# Patient Record
Sex: Female | Born: 1935 | Race: Black or African American | Hispanic: No | Smoking: Never smoker
Health system: Southern US, Community
[De-identification: ages and names within clinical notes are randomized; demographics above are authoritative.]

## PROBLEM LIST (undated history)

## (undated) DIAGNOSIS — F0390 Unspecified dementia without behavioral disturbance: Secondary | ICD-10-CM

## (undated) DIAGNOSIS — I10 Essential (primary) hypertension: Secondary | ICD-10-CM

## (undated) DIAGNOSIS — F0280 Dementia in other diseases classified elsewhere without behavioral disturbance: Secondary | ICD-10-CM

## (undated) DIAGNOSIS — K5900 Constipation, unspecified: Secondary | ICD-10-CM

## (undated) DIAGNOSIS — N390 Urinary tract infection, site not specified: Secondary | ICD-10-CM

## (undated) DIAGNOSIS — I739 Peripheral vascular disease, unspecified: Secondary | ICD-10-CM

## (undated) DIAGNOSIS — G309 Alzheimer's disease, unspecified: Secondary | ICD-10-CM

## (undated) DIAGNOSIS — C189 Malignant neoplasm of colon, unspecified: Secondary | ICD-10-CM

## (undated) DIAGNOSIS — I639 Cerebral infarction, unspecified: Secondary | ICD-10-CM

## (undated) DIAGNOSIS — D649 Anemia, unspecified: Secondary | ICD-10-CM

## (undated) DIAGNOSIS — N189 Chronic kidney disease, unspecified: Secondary | ICD-10-CM

## (undated) HISTORY — PX: NO PAST SURGERIES: SHX2092

---

## 2004-06-06 ENCOUNTER — Ambulatory Visit: Payer: Self-pay | Admitting: Family Medicine

## 2004-10-04 ENCOUNTER — Ambulatory Visit: Payer: Self-pay | Admitting: Family Medicine

## 2005-06-20 ENCOUNTER — Ambulatory Visit: Payer: Self-pay | Admitting: Gastroenterology

## 2005-06-27 ENCOUNTER — Ambulatory Visit: Payer: Self-pay | Admitting: General Surgery

## 2005-06-27 ENCOUNTER — Other Ambulatory Visit: Payer: Self-pay

## 2005-07-04 ENCOUNTER — Inpatient Hospital Stay: Payer: Self-pay | Admitting: General Surgery

## 2005-07-09 ENCOUNTER — Inpatient Hospital Stay: Payer: Self-pay | Admitting: General Surgery

## 2005-07-09 ENCOUNTER — Ambulatory Visit: Payer: Self-pay | Admitting: General Surgery

## 2006-08-24 ENCOUNTER — Emergency Department: Payer: Self-pay | Admitting: Internal Medicine

## 2006-08-28 ENCOUNTER — Ambulatory Visit: Payer: Self-pay | Admitting: Family Medicine

## 2007-02-22 ENCOUNTER — Emergency Department: Payer: Self-pay | Admitting: Emergency Medicine

## 2007-02-23 ENCOUNTER — Other Ambulatory Visit: Payer: Self-pay

## 2007-09-18 ENCOUNTER — Ambulatory Visit: Payer: Self-pay | Admitting: Family Medicine

## 2008-09-22 ENCOUNTER — Ambulatory Visit: Payer: Self-pay | Admitting: Family Medicine

## 2009-07-17 ENCOUNTER — Emergency Department: Payer: Self-pay | Admitting: Internal Medicine

## 2009-07-28 ENCOUNTER — Emergency Department: Payer: Self-pay | Admitting: Emergency Medicine

## 2010-05-26 IMAGING — CR DG CHEST 2V
1 series · 2 of 2 positions shown · non-contrast
Comparison: none

REASON FOR EXAM: SOB
COMMENTS:

[Series 1: view not recorded · 0.17mm/px · 2 of 2 slices shown]
[im 1/2]
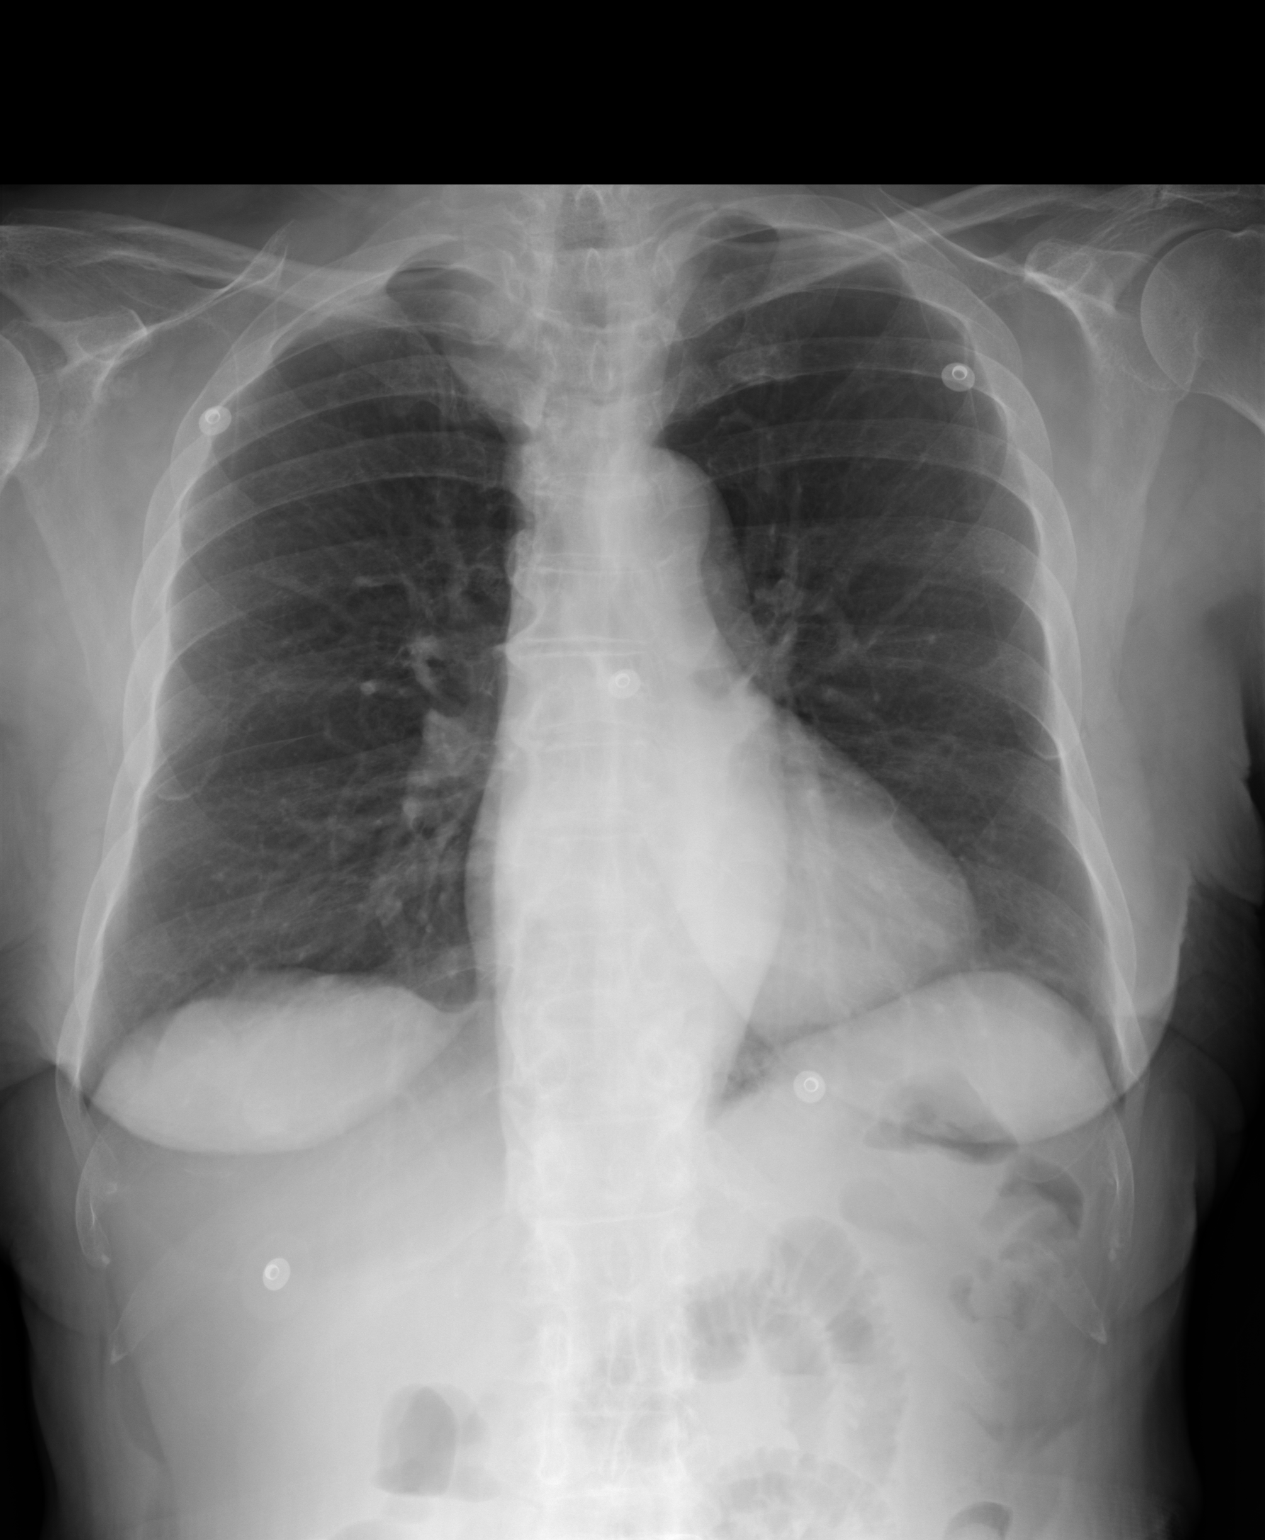
[im 2/2]
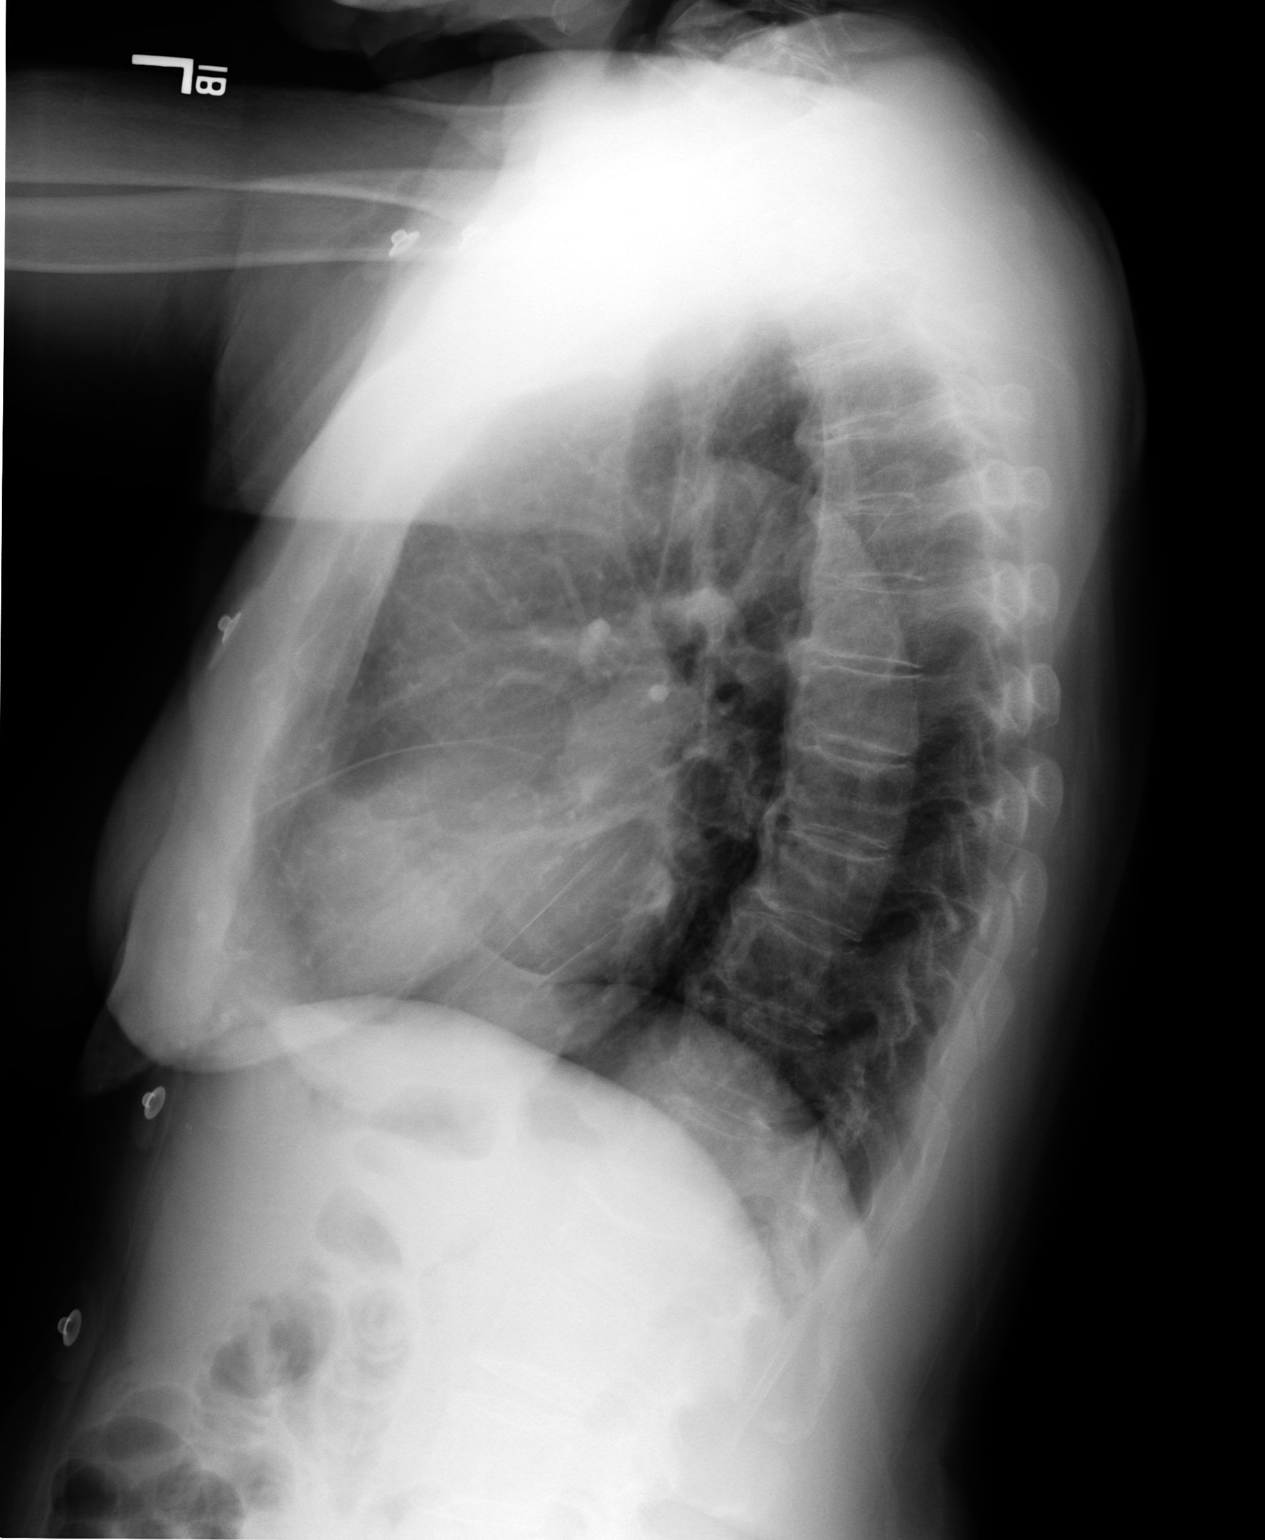

[2 of 2 positions shown; findings below may reference images not displayed]

PROCEDURE:     DXR - DXR CHEST PA (OR AP) AND LATERAL  - July 28, 2009  [DATE]

RESULT:     Comparison is made to the exam of 07/17/2009.

Degenerative changes are seen in the spine. The cardiac silhouette is
normal. There is no edema, infiltrate, effusion or pneumothorax. The bony
and mediastinal structures are unremarkable.
IMPRESSION: No acute cardiopulmonary disease.

## 2010-10-30 ENCOUNTER — Emergency Department: Payer: Self-pay | Admitting: Emergency Medicine

## 2011-03-05 ENCOUNTER — Ambulatory Visit: Payer: Self-pay | Admitting: Family Medicine

## 2012-10-27 ENCOUNTER — Emergency Department: Payer: Self-pay | Admitting: Unknown Physician Specialty

## 2012-10-27 LAB — CBC
HCT: 40.6 % (ref 35.0–47.0)
HGB: 13.5 g/dL (ref 12.0–16.0)
MCV: 88 fL (ref 80–100)
Platelet: 194 10*3/uL (ref 150–440)
RBC: 4.61 10*6/uL (ref 3.80–5.20)
WBC: 8.6 10*3/uL (ref 3.6–11.0)

## 2012-10-27 LAB — COMPREHENSIVE METABOLIC PANEL
Albumin: 3.2 g/dL — ABNORMAL LOW (ref 3.4–5.0)
Alkaline Phosphatase: 94 U/L (ref 50–136)
Anion Gap: 5 — ABNORMAL LOW (ref 7–16)
BUN: 22 mg/dL — ABNORMAL HIGH (ref 7–18)
Chloride: 103 mmol/L (ref 98–107)
Co2: 31 mmol/L (ref 21–32)
Creatinine: 0.93 mg/dL (ref 0.60–1.30)
EGFR (African American): >60
Osmolality: 284 (ref 275–301)
SGOT(AST): 23 U/L (ref 15–37)
Sodium: 139 mmol/L (ref 136–145)
Total Protein: 6.9 g/dL (ref 6.4–8.2)

## 2012-10-27 LAB — TROPONIN I: Troponin-I: <0.02 ng/mL

## 2012-10-28 LAB — TSH: Thyroid Stimulating Horm: 1.98 u[IU]/mL

## 2012-10-28 LAB — URINALYSIS, COMPLETE
Bilirubin,UR: NEGATIVE
Blood: NEGATIVE
Glucose,UR: >=500 mg/dL (ref 0–75)
Ketone: NEGATIVE
Leukocyte Esterase: NEGATIVE
Nitrite: NEGATIVE
Ph: 5 (ref 4.5–8.0)
Protein: NEGATIVE
Specific Gravity: 1.027 (ref 1.003–1.030)
Squamous Epithelial: 1

## 2013-09-29 ENCOUNTER — Emergency Department: Payer: Self-pay | Admitting: Emergency Medicine

## 2013-10-01 ENCOUNTER — Emergency Department: Payer: Self-pay | Admitting: Emergency Medicine

## 2013-10-04 ENCOUNTER — Emergency Department: Payer: Self-pay | Admitting: Emergency Medicine

## 2013-10-04 LAB — WOUND CULTURE

## 2013-12-12 ENCOUNTER — Emergency Department: Payer: Self-pay | Admitting: Emergency Medicine

## 2013-12-12 LAB — CBC
HCT: 34 % — ABNORMAL LOW (ref 35.0–47.0)
HGB: 10.9 g/dL — ABNORMAL LOW (ref 12.0–16.0)
MCH: 29.1 pg (ref 26.0–34.0)
MCHC: 32.1 g/dL (ref 32.0–36.0)
MCV: 91 fL (ref 80–100)
Platelet: 193 10*3/uL (ref 150–440)
RBC: 3.75 10*6/uL — AB (ref 3.80–5.20)
RDW: 13.2 % (ref 11.5–14.5)
WBC: 6.7 10*3/uL (ref 3.6–11.0)

## 2013-12-12 LAB — COMPREHENSIVE METABOLIC PANEL
ALK PHOS: 60 U/L
ANION GAP: 7 (ref 7–16)
AST: 18 U/L (ref 15–37)
Albumin: 2.7 g/dL — ABNORMAL LOW (ref 3.4–5.0)
BILIRUBIN TOTAL: 0.3 mg/dL (ref 0.2–1.0)
BUN: 25 mg/dL — AB (ref 7–18)
CO2: 32 mmol/L (ref 21–32)
CREATININE: 1.31 mg/dL — AB (ref 0.60–1.30)
Calcium, Total: 8.2 mg/dL — ABNORMAL LOW (ref 8.5–10.1)
Chloride: 108 mmol/L — ABNORMAL HIGH (ref 98–107)
GFR CALC AF AMER: 44 — AB
GFR CALC NON AF AMER: 38 — AB
Glucose: 144 mg/dL — ABNORMAL HIGH (ref 65–99)
OSMOLALITY: 299 (ref 275–301)
Potassium: 3.8 mmol/L (ref 3.5–5.1)
SGPT (ALT): 17 U/L
Sodium: 147 mmol/L — ABNORMAL HIGH (ref 136–145)
TOTAL PROTEIN: 5.6 g/dL — AB (ref 6.4–8.2)

## 2013-12-12 LAB — URINALYSIS, COMPLETE
BACTERIA: NONE SEEN
BLOOD: NEGATIVE
Bilirubin,UR: NEGATIVE
GLUCOSE, UR: NEGATIVE mg/dL (ref 0–75)
Hyaline Cast: 6
Ketone: NEGATIVE
LEUKOCYTE ESTERASE: NEGATIVE
Nitrite: NEGATIVE
Ph: 5 (ref 4.5–8.0)
Protein: NEGATIVE
SPECIFIC GRAVITY: 1.024 (ref 1.003–1.030)
Squamous Epithelial: 17

## 2015-02-17 ENCOUNTER — Emergency Department: Payer: Medicare Other

## 2015-02-17 ENCOUNTER — Emergency Department
Admission: EM | Admit: 2015-02-17 | Discharge: 2015-02-18 | Disposition: A | Discharge status: Home / Self Care | Payer: Medicare Other | Attending: Emergency Medicine | Admitting: Emergency Medicine

## 2015-02-17 DIAGNOSIS — R4182 Altered mental status, unspecified: Secondary | ICD-10-CM | POA: Diagnosis not present

## 2015-02-17 DIAGNOSIS — I10 Essential (primary) hypertension: Secondary | ICD-10-CM | POA: Insufficient documentation

## 2015-02-17 DIAGNOSIS — Z7982 Long term (current) use of aspirin: Secondary | ICD-10-CM | POA: Insufficient documentation

## 2015-02-17 DIAGNOSIS — R0989 Other specified symptoms and signs involving the circulatory and respiratory systems: Secondary | ICD-10-CM | POA: Diagnosis present

## 2015-02-17 DIAGNOSIS — Z79899 Other long term (current) drug therapy: Secondary | ICD-10-CM | POA: Insufficient documentation

## 2015-02-17 DIAGNOSIS — Z7902 Long term (current) use of antithrombotics/antiplatelets: Secondary | ICD-10-CM | POA: Diagnosis not present

## 2015-02-17 DIAGNOSIS — R531 Weakness: Secondary | ICD-10-CM | POA: Diagnosis not present

## 2015-02-17 NOTE — ED Notes (Signed)
Pt washed and cleaned of stool by Lynelle Smoke, EDT and Nellie, RN. Pericare performed.

## 2015-02-17 NOTE — ED Provider Notes (Addendum)
[facility] Emergency Department Provider Note  Time seen: 11:45 PM  I have reviewed the triage vital signs and the nursing notes.   HISTORY  Chief Complaint Choking    HPI [name] is a 81 y.o. female with a past medical history of dementia, hypertension who presents to the emergency department after choking. According to report family state the patient was eating a hot dog when she began choking, and became nearly unresponsive. They called EMS. EMS states upon arrival the patient is awake, and talking to them, no distress noted. Patient remains well upon arrival to the emergency department. Patient has dementia, but denies any complaints. Denies any chest pain or trouble breathing. Currently awaiting family arrival.   No past medical history on file.  There are no active problems to display for this patient.   No past surgical history on file.  Current Outpatient Rx  Name  Route  Sig  Dispense  Refill  . aspirin EC 81 MG tablet   Oral   Take 81 mg by mouth daily.         Marland Kitchen atenolol (TENORMIN) 50 MG tablet   Oral   Take 50 mg by mouth daily.         Marland Kitchen CALCIUM-VITAMIN D PO   Oral   Take 1 tablet by mouth 2 (two) times daily.         . clopidogrel (PLAVIX) 75 MG tablet   Oral   Take 75 mg by mouth daily.         Marland Kitchen donepezil (ARICEPT) 10 MG tablet   Oral   Take 10 mg by mouth daily.         . felodipine (PLENDIL) 10 MG 24 hr tablet   Oral   Take 10 mg by mouth daily.         . hydrochlorothiazide (MICROZIDE) 12.5 MG capsule   Oral   Take 12.5 mg by mouth daily.         . memantine (NAMENDA) 5 MG tablet   Oral   Take 5 mg by mouth daily.         . potassium chloride SA (K-DUR,KLOR-CON) 20 MEQ tablet   Oral   Take 20 mEq by mouth daily.         . ranitidine (ZANTAC) 150 MG tablet   Oral   Take 150 mg by mouth daily.         . sertraline (ZOLOFT) 25 MG tablet   Oral   Take 50 mg by mouth daily.          . traZODone (DESYREL) 100 MG tablet   Oral   Take 100 mg by mouth at bedtime. Take 2 tablets PO at bedtime.           Allergies Review of patient's allergies indicates not on file.  No family history on file.  Social History Social History  Substance Use Topics  . Smoking status: Not on file  . Smokeless tobacco: Not on file  . Alcohol Use: Not on file    Review of Systems Constitutional: Negative for fever. Cardiovascular: Negative for chest pain. Respiratory: Negative for shortness of breath. Gastrointestinal: Negative for abdominal pain 10-point ROS otherwise negative, although likely limited by patient's dementia.  ____________________________________________   PHYSICAL EXAM:  VITAL SIGNS: ED Triage Vitals  Enc Vitals Group     BP 02/17/15 2254 144/71 mmHg     Pulse Rate 02/17/15 2254 52  Resp 02/17/15 2254 14     Temp 02/17/15 2254 97.4 F (36.3 C)     Temp Source 02/17/15 2254 Oral     SpO2 02/17/15 2248 98 %     Weight 02/17/15 2254 150 lb (68.04 kg)     Height 02/17/15 2254 5\' 3"  (1.6 m)     Head Cir --      Peak Flow --      Pain Score --      Pain Loc --      Pain Edu? --      Excl. in [place]? --    Constitutional: Alert. Well appearing and in no distress. Eyes: Normal exam ENT   Head: Normocephalic and atraumatic.   Mouth/Throat: Mucous membranes are moist. Cardiovascular: Normal rate, regular rhythm. Respiratory: Normal respiratory effort without tachypnea nor retractions. Breath sounds are clear and equal bilaterally. No wheezes/rales/rhonchi. Gastrointestinal: Soft and nontender. No distention.   Neurologic:  Answers simple questions, follows commands. Skin:  Skin is warm, dry and intact.  Psychiatric: Mood and affect are normal.  ____________________________________________   RADIOLOGY  Chest x-ray shows no acute abnormality  EKG reviewed and interpreted by myself shows sinus rhythm at 53 bpm, widened QRS, left axis  deviation, nonspecific ST changes.  ____________________________________________   INITIAL IMPRESSION / ASSESSMENT AND PLAN / ED COURSE  Pertinent labs & imaging results that were available during my care of the patient were reviewed by me and considered in my medical decision making (see chart for details).  Patient appears very well and the emergency department. Clear breath sounds, no distress. Answers questions, follows commands. I'll call the patient's family, they are on their way to the emergency department. Once the family arrives we will obtain further details and further history. We'll obtain a chest x-ray to help rule out aspiration.  Patient's daughter is now here, gives a slightly different story. States the patient was eating dinner, and the daughter left the room and when the daughter came back the patient was slumped over at the table. States she got the patient on the ground and the patient started to come back around, they called EMS and brought the patient to the emergency department. Daughter states the patient is now at her baseline. But states she has never done this before. Given this new information we'll proceed with a CT head, blood work and urinalysis to help further evaluate.  Labs are largely within normal limits. We were unable to obtain an in and out urinalysis after multiple times. I discussed this with the daughter, she states with everything else looking normal she is comfortable going home and following up with a primary care doctor if needed. I discussed very strict return precautions for any further concerning symptoms. Daughter is agreeable to plan. ____________________________________________   FINAL CLINICAL IMPRESSION(S) / ED DIAGNOSES  Weakness Altered mental status, resolved   Harvest Dark, MD 02/18/15 8250  Harvest Dark, MD 02/18/15 [phone]

## 2015-02-17 NOTE — ED Notes (Signed)
Pt arrives to ED via [facility] for c/o choking leading to unresponsiveness. Per EMS, pt was eating a hotdog with the family around 10pm when she began to choke and became unresponsive. EMS reports the pt was alert upon their arrival. EMS reports pt has h/x of dementia and incontinence; pt noted to have a Depends undergarment full of stool upon arrival. Pt is in NAD, respirations even, regular and unlabored.

## 2015-02-18 ENCOUNTER — Emergency Department: Payer: Medicare Other

## 2015-02-18 DIAGNOSIS — R4182 Altered mental status, unspecified: Secondary | ICD-10-CM | POA: Diagnosis not present

## 2015-02-18 LAB — CBC WITH DIFFERENTIAL/PLATELET
BASOS ABS: 0.1 10*3/uL (ref 0–0.1)
BASOS PCT: 2 %
EOS PCT: 3 %
Eosinophils Absolute: 0.2 10*3/uL (ref 0–0.7)
HCT: 37.7 % (ref 35.0–47.0)
Hemoglobin: 12.2 g/dL (ref 12.0–16.0)
Lymphocytes Relative: 21 %
Lymphs Abs: 1 10*3/uL (ref 1.0–3.6)
MCH: 28.3 pg (ref 26.0–34.0)
MCHC: 32.3 g/dL (ref 32.0–36.0)
MCV: 87.5 fL (ref 80.0–100.0)
MONO ABS: 0.5 10*3/uL (ref 0.2–0.9)
Monocytes Relative: 10 %
Neutro Abs: 3.3 10*3/uL (ref 1.4–6.5)
Neutrophils Relative %: 64 %
PLATELETS: 156 10*3/uL (ref 150–440)
RBC: 4.31 MIL/uL (ref 3.80–5.20)
RDW: 13.5 % (ref 11.5–14.5)
WBC: 5.1 10*3/uL (ref 3.6–11.0)

## 2015-02-18 LAB — COMPREHENSIVE METABOLIC PANEL
ALT: 13 U/L — AB (ref 14–54)
AST: 21 U/L (ref 15–41)
Albumin: 3.4 g/dL — ABNORMAL LOW (ref 3.5–5.0)
Alkaline Phosphatase: 60 U/L (ref 38–126)
Anion gap: 3 — ABNORMAL LOW (ref 5–15)
BILIRUBIN TOTAL: 0.4 mg/dL (ref 0.3–1.2)
BUN: 33 mg/dL — AB (ref 6–20)
CO2: 31 mmol/L (ref 22–32)
CREATININE: 1.2 mg/dL — AB (ref 0.44–1.00)
Calcium: 8.9 mg/dL (ref 8.9–10.3)
Chloride: 109 mmol/L (ref 101–111)
GFR calc Af Amer: 46 mL/min — ABNORMAL LOW (ref >60)
GFR, EST NON AFRICAN AMERICAN: 40 mL/min — AB (ref >60)
Glucose, Bld: 134 mg/dL — ABNORMAL HIGH (ref 65–99)
Potassium: 3.4 mmol/L — ABNORMAL LOW (ref 3.5–5.1)
Sodium: 143 mmol/L (ref 135–145)
TOTAL PROTEIN: 6.3 g/dL — AB (ref 6.5–8.1)

## 2015-02-18 LAB — TROPONIN I

## 2015-02-18 LAB — C DIFFICILE QUICK SCREEN W PCR REFLEX
C DIFFICILE (CDIFF) TOXIN: NEGATIVE
C DIFFICLE (CDIFF) ANTIGEN: NEGATIVE
C Diff interpretation: NEGATIVE

## 2015-02-18 NOTE — Discharge Instructions (Signed)
As we have discussed your mother's workup today in the emergency department as shown largely normal results. Please follow-up with her primary care physician in the next 1-2 days for recheck/reevaluation. Please return to the emergency department for any concerning symptoms, or for any further episodes of weakness.

## 2015-02-18 NOTE — ED Notes (Signed)
Patient and daughter with no complaints at this time. Respirations even and unlabored. Skin warm/dry. Discharge instructions reviewed with patient and daughter at this time. Patient and daughter given opportunity to voice concerns/ask questions. IV removed per policy and band-aid applied to site. Patient discharged at this time and left Emergency Department, via wheelchair.   

## 2016-04-27 ENCOUNTER — Inpatient Hospital Stay
Admission: EM | Admit: 2016-04-27 | Discharge: 2016-04-28 | DRG: 064 | Payer: Medicare (Managed Care) | Attending: Internal Medicine | Admitting: Internal Medicine

## 2016-04-27 ENCOUNTER — Encounter: Payer: Self-pay | Admitting: Emergency Medicine

## 2016-04-27 ENCOUNTER — Emergency Department: Payer: Medicare (Managed Care)

## 2016-04-27 DIAGNOSIS — N390 Urinary tract infection, site not specified: Secondary | ICD-10-CM | POA: Diagnosis present

## 2016-04-27 DIAGNOSIS — Z85038 Personal history of other malignant neoplasm of large intestine: Secondary | ICD-10-CM

## 2016-04-27 DIAGNOSIS — G309 Alzheimer's disease, unspecified: Secondary | ICD-10-CM | POA: Diagnosis present

## 2016-04-27 DIAGNOSIS — Z7982 Long term (current) use of aspirin: Secondary | ICD-10-CM

## 2016-04-27 DIAGNOSIS — I739 Peripheral vascular disease, unspecified: Secondary | ICD-10-CM | POA: Diagnosis present

## 2016-04-27 DIAGNOSIS — G9340 Encephalopathy, unspecified: Secondary | ICD-10-CM | POA: Diagnosis present

## 2016-04-27 DIAGNOSIS — Z8673 Personal history of transient ischemic attack (TIA), and cerebral infarction without residual deficits: Secondary | ICD-10-CM

## 2016-04-27 DIAGNOSIS — K5900 Constipation, unspecified: Secondary | ICD-10-CM

## 2016-04-27 DIAGNOSIS — N183 Chronic kidney disease, stage 3 (moderate): Secondary | ICD-10-CM | POA: Diagnosis present

## 2016-04-27 DIAGNOSIS — Z79899 Other long term (current) drug therapy: Secondary | ICD-10-CM

## 2016-04-27 DIAGNOSIS — F0280 Dementia in other diseases classified elsewhere without behavioral disturbance: Secondary | ICD-10-CM | POA: Diagnosis present

## 2016-04-27 DIAGNOSIS — R7989 Other specified abnormal findings of blood chemistry: Secondary | ICD-10-CM

## 2016-04-27 DIAGNOSIS — N189 Chronic kidney disease, unspecified: Secondary | ICD-10-CM | POA: Diagnosis present

## 2016-04-27 DIAGNOSIS — I129 Hypertensive chronic kidney disease with stage 1 through stage 4 chronic kidney disease, or unspecified chronic kidney disease: Secondary | ICD-10-CM | POA: Diagnosis present

## 2016-04-27 DIAGNOSIS — Z7902 Long term (current) use of antithrombotics/antiplatelets: Secondary | ICD-10-CM

## 2016-04-27 DIAGNOSIS — I611 Nontraumatic intracerebral hemorrhage in hemisphere, cortical: Principal | ICD-10-CM | POA: Diagnosis present

## 2016-04-27 DIAGNOSIS — I10 Essential (primary) hypertension: Secondary | ICD-10-CM | POA: Diagnosis present

## 2016-04-27 DIAGNOSIS — R4189 Other symptoms and signs involving cognitive functions and awareness: Secondary | ICD-10-CM

## 2016-04-27 HISTORY — DX: Dementia in other diseases classified elsewhere, unspecified severity, without behavioral disturbance, psychotic disturbance, mood disturbance, and anxiety: F02.80

## 2016-04-27 HISTORY — DX: Cerebral infarction, unspecified: I63.9

## 2016-04-27 HISTORY — DX: Essential (primary) hypertension: I10

## 2016-04-27 HISTORY — DX: Unspecified dementia, unspecified severity, without behavioral disturbance, psychotic disturbance, mood disturbance, and anxiety: F03.90

## 2016-04-27 HISTORY — DX: Alzheimer's disease, unspecified: G30.9

## 2016-04-27 HISTORY — DX: Malignant neoplasm of colon, unspecified: C18.9

## 2016-04-27 HISTORY — DX: Peripheral vascular disease, unspecified: I73.9

## 2016-04-27 HISTORY — DX: Urinary tract infection, site not specified: N39.0

## 2016-04-27 HISTORY — DX: Chronic kidney disease, unspecified: N18.9

## 2016-04-27 HISTORY — DX: Constipation, unspecified: K59.00

## 2016-04-27 HISTORY — DX: Anemia, unspecified: D64.9

## 2016-04-27 LAB — COMPREHENSIVE METABOLIC PANEL
ALBUMIN: 3.8 g/dL (ref 3.5–5.0)
ALK PHOS: 74 U/L (ref 38–126)
ALT: 16 U/L (ref 14–54)
ANION GAP: 12 (ref 5–15)
AST: 38 U/L (ref 15–41)
BUN: 19 mg/dL (ref 6–20)
CALCIUM: 9.3 mg/dL (ref 8.9–10.3)
CHLORIDE: 100 mmol/L — AB (ref 101–111)
CO2: 26 mmol/L (ref 22–32)
Creatinine, Ser: 0.77 mg/dL (ref 0.44–1.00)
GFR calc Af Amer: >60 mL/min (ref >60)
GFR calc non Af Amer: >60 mL/min (ref >60)
GLUCOSE: 143 mg/dL — AB (ref 65–99)
Potassium: 3 mmol/L — ABNORMAL LOW (ref 3.5–5.1)
SODIUM: 138 mmol/L (ref 135–145)
Total Bilirubin: 0.5 mg/dL (ref 0.3–1.2)
Total Protein: 7.3 g/dL (ref 6.5–8.1)

## 2016-04-27 LAB — URINALYSIS, COMPLETE (UACMP) WITH MICROSCOPIC
Bilirubin Urine: NEGATIVE
Glucose, UA: 150 mg/dL — AB
Hgb urine dipstick: NEGATIVE
KETONES UR: 20 mg/dL — AB
Nitrite: NEGATIVE
PROTEIN: 30 mg/dL — AB
SQUAMOUS EPITHELIAL / LPF: NONE SEEN
Specific Gravity, Urine: 1.017 (ref 1.005–1.030)
pH: 6 (ref 5.0–8.0)

## 2016-04-27 LAB — CBC
HEMATOCRIT: 42.1 % (ref 35.0–47.0)
HEMOGLOBIN: 14.4 g/dL (ref 12.0–16.0)
MCH: 29.1 pg (ref 26.0–34.0)
MCHC: 34.2 g/dL (ref 32.0–36.0)
MCV: 85.1 fL (ref 80.0–100.0)
Platelets: 216 10*3/uL (ref 150–440)
RBC: 4.95 MIL/uL (ref 3.80–5.20)
RDW: 13.4 % (ref 11.5–14.5)
WBC: 10.6 10*3/uL (ref 3.6–11.0)

## 2016-04-27 LAB — LACTIC ACID, PLASMA
LACTIC ACID, VENOUS: 2.3 mmol/L — AB (ref 0.5–1.9)
Lactic Acid, Venous: 1.4 mmol/L (ref 0.5–1.9)

## 2016-04-27 LAB — LIPASE, BLOOD: LIPASE: 19 U/L (ref 11–51)

## 2016-04-27 MED ORDER — IOPAMIDOL (ISOVUE-300) INJECTION 61%
100.0000 mL | Freq: Once | INTRAVENOUS | Status: AC | PRN
  Administered 2016-04-27: 100 mL via INTRAVENOUS

## 2016-04-27 MED ORDER — MORPHINE SULFATE (PF) 2 MG/ML IV SOLN
INTRAVENOUS | Status: AC
  Administered 2016-04-27: 2 mg via INTRAVENOUS
  Filled 2016-04-27: qty 1

## 2016-04-27 MED ORDER — ONDANSETRON HCL 4 MG/2ML IJ SOLN
INTRAMUSCULAR | Status: AC
  Administered 2016-04-27: 4 mg via INTRAVENOUS
  Filled 2016-04-27: qty 2

## 2016-04-27 MED ORDER — MORPHINE SULFATE (PF) 2 MG/ML IV SOLN
2.0000 mg | Freq: Once | INTRAVENOUS | Status: AC
  Administered 2016-04-27: 2 mg via INTRAVENOUS

## 2016-04-27 MED ORDER — SODIUM CHLORIDE 0.9 % IV BOLUS (SEPSIS)
500.0000 mL | INTRAVENOUS | Status: AC
  Administered 2016-04-27: 500 mL via INTRAVENOUS

## 2016-04-27 MED ORDER — SODIUM CHLORIDE 0.9 % IV SOLN
30.0000 meq | Freq: Once | INTRAVENOUS | Status: AC
  Administered 2016-04-27: 30 meq via INTRAVENOUS
  Filled 2016-04-27: qty 15

## 2016-04-27 MED ORDER — ONDANSETRON HCL 4 MG/2ML IJ SOLN
4.0000 mg | Freq: Once | INTRAMUSCULAR | Status: AC
  Administered 2016-04-27: 4 mg via INTRAVENOUS

## 2016-04-27 MED ORDER — IOPAMIDOL (ISOVUE-300) INJECTION 61%
30.0000 mL | Freq: Once | INTRAVENOUS | Status: AC | PRN
  Administered 2016-04-27: 30 mL via ORAL

## 2016-04-27 NOTE — ED Notes (Signed)
Per Durenda Guthrie, MD, states pt received 2g rocephin IM and 731mL NS IV today, had UA done, positive with nitrite, leukocyte, and blood

## 2016-04-27 NOTE — ED Triage Notes (Signed)
Pt to [name] via EMS from [place], ems report pt had 1 episode vomiting, dark brown, unsure if bloody.  Pt receiving IV antibiotics w/ [facility], unsure for what type of infection.  Pt nonverbal with EMS and this nurse, does not appear to be in any distress.

## 2016-04-27 NOTE — ED Provider Notes (Signed)
[facility] Emergency Department Provider Note  ____________________________________________   First MD Initiated Contact with Patient 04/27/16 1833     (approximate)  I have reviewed the triage vital signs and the nursing notes.   HISTORY  Chief Complaint Emesis  Level 5 caveat:  patient has advanced dementia and is non-verbal at baseline.  HPI [name] is a 82 y.o. female with severe dementia who presents for evaluation of altered mental status.  She normally lives at home with family, but while they are away for the holidays, the patient is having a short term stay at [facility].  Normally she is able to at least partially care for herself, and she is part of the [facility] program.  They saw her this morning for AMS, and she was evaluated and treated by Dr. Durenda Guthrie, who later came to the ED and discussed the case in person with me.  The patient was found to have a profound UTI with +nitrites and +leukocytes.  She was treated with ceftriaxone 2 grams IM and received a 791mL NS IV bolus this morning, and a urine culture is pending.  Unfortunately the patient decompensated later in the day and was essentially unresponsive, unable to care for herself, and ill-appearing, then she had an episode of "dark" emesis concerning for blood, so she was transported to the Emergency Department.  She cannot provide any additional history.  She awakens to voice and touch, but otherwise is not responsive.   Past Medical History:  Diagnosis Date  . Alzheimer's dementia   . Anemia   . CKD (chronic kidney disease)    stage 3  . Colon cancer ([place])   . Constipation   . CVA (cerebral vascular accident) ([place])   . Dementia   . Hypertension   . PVD (peripheral vascular disease) ([place])   . UTI (urinary tract infection)     Patient Active Problem List   Diagnosis Date Noted  . Acute encephalopathy 04/28/2016  . UTI (urinary tract infection) 04/28/2016    . Alzheimer's dementia 04/28/2016  . HTN (hypertension) 04/28/2016  . PVD (peripheral vascular disease) ([place]) 04/28/2016    Past Surgical History:  Procedure Laterality Date  . NO PAST SURGERIES      Prior to Admission medications   Medication Sig Start Date End Date Taking? Authorizing Provider  aspirin EC 81 MG tablet Take 81 mg by mouth daily.   Yes Historical Provider, MD  atenolol (TENORMIN) 50 MG tablet Take 50 mg by mouth daily.   Yes Historical Provider, MD  azelastine (ASTELIN) 0.1 % nasal spray Place 1 spray into both nostrils 2 (two) times daily. Use in each nostril as directed   Yes Historical Provider, MD  Calcium Carbonate-Vitamin D3 (CALCIUM 600+D3) 600-400 MG-UNIT TABS Take 1 tablet by mouth 2 (two) times daily.   Yes Historical Provider, MD  clopidogrel (PLAVIX) 75 MG tablet Take 75 mg by mouth daily.   Yes Historical Provider, MD  felodipine (PLENDIL) 10 MG 24 hr tablet Take 10 mg by mouth daily.   Yes Historical Provider, MD  guaiFENesin-dextromethorphan (ROBITUSSIN DM) 100-10 MG/5ML syrup Take 10 mLs by mouth every 4 (four) hours as needed for cough.   Yes Historical Provider, MD  hydrochlorothiazide (MICROZIDE) 12.5 MG capsule Take 12.5 mg by mouth daily.   Yes Historical Provider, MD  loratadine (CLARITIN) 10 MG tablet Take 10 mg by mouth daily.   Yes Historical Provider, MD  magnesium hydroxide (MILK OF MAGNESIA) 400  MG/5ML suspension Take 10 mLs by mouth daily as needed for mild constipation.   Yes Historical Provider, MD  memantine (NAMENDA) 5 MG tablet Take 5 mg by mouth daily.   Yes Historical Provider, MD  potassium chloride SA (K-DUR,KLOR-CON) 20 MEQ tablet Take 20 mEq by mouth daily.   Yes Historical Provider, MD  ranitidine (ZANTAC) 150 MG tablet Take 150 mg by mouth daily.   Yes Historical Provider, MD  senna-docusate (SENOKOT-S) 8.6-50 MG tablet Take 1 tablet by mouth daily.   Yes Historical Provider, MD  traZODone (DESYREL) 100 MG tablet Take 100 mg by  mouth at bedtime. Take 2 tablets PO at bedtime.   Yes Historical Provider, MD    Allergies Patient has no known allergies.  Family History  Problem Relation Age of Onset  . Family history unknown: Yes    Social History Social History  Substance Use Topics  . Smoking status: Never Smoker  . Smokeless tobacco: Never Used  . Alcohol use No    Review of Systems  Level 5 caveat:  patient has advanced dementia and is non-verbal at baseline. ____________________________________________   PHYSICAL EXAM:  VITAL SIGNS: ED Triage Vitals [04/27/16 1749]  Enc Vitals Group     BP (!) 152/82     Pulse Rate 94     Resp 18     Temp 98.5 F (36.9 C)     Temp Source Oral     SpO2 97 %     Weight 165 lb (74.8 kg)     Height 5\' 6"  (1.676 m)     Head Circumference      Peak Flow      Pain Score      Pain Loc      Pain Edu?      Excl. in [place]?     Constitutional: Awakens to touch and voice.  Does not appear to be in distress, but is minimally interactive. Eyes: Conjunctivae are normal. PERRL. EOMI. Head: Atraumatic. Nose: No congestion/rhinnorhea. Mouth/Throat: Edentulous.  Mucous membranes are moist.   Neck: No stridor.  No meningeal signs.   Cardiovascular: Borderline tachycardia, regular rhythm. Good peripheral circulation. Grossly normal heart sounds. Respiratory: Normal respiratory effort.  No retractions. Lungs CTAB. Gastrointestinal: Soft with grimacing to palpation.  Rectal exam demonstrates fecal impaction too proximal to reach with my fingers to disimpact.  Light brown stool, Guiac negative, QC passed, nurse present throughout exam. Musculoskeletal: No lower extremity tenderness nor edema. No gross deformities of extremities. Neurologic:  Moves all four extremities, but unable to participate in exam Skin:  Skin is warm, dry and intact. No rash noted.  ____________________________________________   LABS (all labs ordered are listed, but only abnormal results are  displayed)  Labs Reviewed  COMPREHENSIVE METABOLIC PANEL - Abnormal; Notable for the following:       Result Value   Potassium 3.0 (*)    Chloride 100 (*)    Glucose, Bld 143 (*)    All other components within normal limits  URINALYSIS, COMPLETE (UACMP) WITH MICROSCOPIC - Abnormal; Notable for the following:    Color, Urine YELLOW (*)    APPearance CLEAR (*)    Glucose, UA 150 (*)    Ketones, ur 20 (*)    Protein, ur 30 (*)    Leukocytes, UA TRACE (*)    Bacteria, UA RARE (*)    All other components within normal limits  LACTIC ACID, PLASMA - Abnormal; Notable for the following:    Lactic Acid,  Venous 2.3 (*)    All other components within normal limits  URINE CULTURE  LIPASE, BLOOD  CBC  LACTIC ACID, PLASMA  BASIC METABOLIC PANEL  CBC   ____________________________________________  EKG  ED ECG REPORT I, Vega Stare, the attending physician, personally viewed and interpreted this ECG.  Date: 04/27/2016 EKG Time: 22:56 Rate: 99 Rhythm: normal sinus rhythm QRS Axis: normal Intervals: LAFB ST/T Wave abnormalities: Non-specific ST segment / T-wave changes, but no evidence of acute ischemia. Conduction Disturbances: none Narrative Interpretation: no evidence of acute ischemia  ____________________________________________  RADIOLOGY   Ct Abdomen Pelvis W Contrast  Result Date: 04/27/2016 CLINICAL DATA:  Emesis and abdominal pain.  Altered mental status. EXAM: CT ABDOMEN AND PELVIS WITH CONTRAST TECHNIQUE: Multidetector CT imaging of the abdomen and pelvis was performed using the standard protocol following bolus administration of intravenous contrast. CONTRAST:  131mL ISOVUE-300 IOPAMIDOL (ISOVUE-300) INJECTION 61% COMPARISON:  None. FINDINGS: Lower chest: No acute abnormality allowing for motion. Tortuous atherosclerotic distal thoracic aorta. Heart is at the upper limits of normal in size. Hepatobiliary: There is a lamellated 2.9 cm gallstone within the gallbladder,  no evidence of pericholecystic inflammation. No biliary dilatation. Scattered small subcentimeter hepatic hypodensities are too small to accurately characterize, likely cysts. A different subcapsular ill-defined hypodensity in segment 6 measuring 2.2 cm is of uncertain etiology. Pancreas: Atrophic parenchyma. No ductal dilatation or inflammation. Spleen: Normal in size without focal abnormality. Adrenals/Urinary Tract: There is an exophytic 2.4 cm nodule from the left adrenal gland. This is homogeneous in density with well-circumscribed borders. Right adrenal gland is normal. There is no hydronephrosis. Right kidney slightly ptotic in appearance with an extrarenal pelvis configuration. Scattered cortical hypodensities throughout both kidneys, majority too small to characterize, however likely simple cysts. There is a complex cysts in the right upper kidney without enhancement it measures 12 mm. No suspicious renal lesion. Ureters are decompressed. Urinary bladder is minimally distended, and displaced anteriorly by rectal stool. Stomach/Bowel: Stomach physiologically distended with fluid. Question of wall thickening in the para pyloric stomach and proximal duodenum. Remaining small bowel is decompressed. No small bowel thickening or inflammation. There is moderate stool throughout the colon with colonic tortuosity. There is a small umbilical hernia that contains nonobstructed of the small bowel and anti mesenteric border of transverse colon, no associated wall thickening or inflammation. A stool ball distends the rectum spanning 7.9 cm. There is mild wall thickening of the distal most sigmoid colon and rectum. Chain sutures at the bases cecum, likely from appendectomy, the appendix is not visualized. Vascular/Lymphatic: Atherosclerosis and tortuosity of the abdominal aorta and its branches. No aneurysm. No retroperitoneal, mesenteric or pelvic adenopathy. Reproductive: Status post hysterectomy. No adnexal masses.  Other: No free air, free fluid, or intra-abdominal fluid collection. Musculoskeletal: Linear air within the right gluteal musculature. Felt to be within veins secondary to IV catheter placement. The bones are under mineralized. Chronic appearing compression deformities of L1, L2, and L4 superior endplates. Multilevel degenerative change throughout spine. Intramedullary rod in the right femur is partially included. IMPRESSION: 1. Moderate stool burden with rectal distention, concerning for fecal impaction. No bowel obstruction. 2. Small umbilical hernia contains noninflamed nonobstructed loop of small bowel and anti mesenteric border of transverse colon. 3. Gallstone without gallbladder inflammation. 4. **An incidental finding of potential clinical significance has been found. Left adrenal nodule measures 2.4 cm. There are no prior exams for comparison. In the absence of malignancy history, recommend adrenal protocol CT. ** 5. **An incidental finding of  potential clinical significance has been found. Ill-defined 2.2 cm hypodense lesion in the right lobe of the liver. Recommend nonemergent hepatic protocol MRI, preferably when patient is able tolerate breath hold technique.** multiple additional subcentimeter hepatic lesions are too small to characterize. 6. Atherosclerosis including coronary artery calcifications. 7. Multiple remote lumbar spine compression fractures. Electronically Signed   By: Jeb Levering M.D.   On: 04/27/2016 21:33   Dg Chest Portable 1 View  Result Date: 04/27/2016 CLINICAL DATA:  Acute onset of elevated lactic acid. Vomiting. Initial encounter. EXAM: PORTABLE CHEST 1 VIEW COMPARISON:  Chest radiograph performed 02/17/2015 FINDINGS: The lungs are well-aerated and clear. There is no evidence of focal opacification, pleural effusion or pneumothorax. The cardiomediastinal silhouette is within normal limits. No acute osseous abnormalities are seen. Loose bodies are noted 1 at the right  joint space. IMPRESSION: No acute cardiopulmonary process seen. Electronically Signed   By: Garald Balding M.D.   On: 04/27/2016 23:20    ____________________________________________   PROCEDURES  Procedure(s) performed:   Procedures   Critical Care performed: No ____________________________________________   INITIAL IMPRESSION / ASSESSMENT AND PLAN / ED COURSE  Pertinent labs & imaging results that were available during my care of the patient were reviewed by me and considered in my medical decision making (see chart for details).   Clinical Course as of Apr 28 245  Fri Apr 27, 2016  2002 Difficult to obtain history given the patient's nonverbal status but her [facility] physician states that she is far from her baseline and her family confirms this.  She winces and apparent pain when I palpate her abdomen.  Her altered mental status and lethargy may simply be the result of a urinary tract infection which is incompletely treated at this point (her first dose of antibiotics being just about 8 hours ago), but given the tenderness to palpation of the abdomen and the report of emesis I will obtain a CT scan of the abdomen and pelvis.  If this is reassuring I will likely need to admit her for encephalopathy thought to be secondary to UTI given she lives in assisted living was currently incapable of doing anything to care for herself.  [CF]    Clinical Course User Index [CF] Hinda Kehr, MD    ____________________________________________  FINAL CLINICAL IMPRESSION(S) / ED DIAGNOSES  Final diagnoses:  Urinary tract infection without hematuria, site unspecified  Encephalopathy  Elevated lactic acid level  Constipation, unspecified constipation type     MEDICATIONS GIVEN DURING THIS VISIT:  Medications  aspirin EC tablet 81 mg (not administered)  atenolol (TENORMIN) tablet 50 mg (not administered)  clopidogrel (PLAVIX) tablet 75 mg (not administered)  felodipine (PLENDIL) 24 hr  tablet 10 mg (not administered)  memantine (NAMENDA) tablet 5 mg (not administered)  famotidine (PEPCID) tablet 20 mg (not administered)  enoxaparin (LOVENOX) injection 40 mg (not administered)  0.9 %  sodium chloride infusion ( Intravenous New Bag/Given 04/28/16 0229)  acetaminophen (TYLENOL) tablet 650 mg (not administered)    Or  acetaminophen (TYLENOL) suppository 650 mg (not administered)  oxyCODONE (Oxy IR/ROXICODONE) immediate release tablet 5 mg (not administered)  ondansetron (ZOFRAN) tablet 4 mg (not administered)    Or  ondansetron (ZOFRAN) injection 4 mg (not administered)  cefTRIAXone (ROCEPHIN) IVPB 2 g (not administered)  cefTRIAXone (ROCEPHIN) IVPB 2 g (not administered)  ondansetron (ZOFRAN) injection 4 mg (4 mg Intravenous Given 04/27/16 1951)  morphine 2 MG/ML injection 2 mg (2 mg Intravenous Given 04/27/16 1951)  sodium chloride  0.9 % bolus 500 mL (0 mLs Intravenous Stopped 04/27/16 2114)  iopamidol (ISOVUE-300) 61 % injection 30 mL (30 mLs Oral Contrast Given 04/27/16 2014)  iopamidol (ISOVUE-300) 61 % injection 100 mL (100 mLs Intravenous Contrast Given 04/27/16 2049)  potassium chloride 30 mEq in sodium chloride 0.9 % 265 mL (KCL MULTIRUN) IVPB (30 mEq Intravenous Given 04/27/16 2206)  sodium chloride 0.9 % bolus 500 mL (0 mLs Intravenous Stopped 04/27/16 2206)     NEW OUTPATIENT MEDICATIONS STARTED DURING THIS VISIT:  Current Discharge Medication List      Current Discharge Medication List      Current Discharge Medication List       Note:  This document was prepared using Dragon voice recognition software and may include unintentional dictation errors.    Hinda Kehr, MD 04/28/16 [phone]

## 2016-04-27 NOTE — ED Notes (Signed)
Pt's family leaving, request call when pt gets bed assignment: Daughter in law- denise: [phone]

## 2016-04-27 NOTE — ED Notes (Signed)
Rate of potassium lowered to 77ml/hr due to pain

## 2016-04-27 NOTE — ED Notes (Signed)
Per family, pt being treated for UTI and received IM injection of antibiotics today

## 2016-04-28 ENCOUNTER — Inpatient Hospital Stay (HOSPITAL_COMMUNITY)
Admission: EM | Admit: 2016-04-28 | Discharge: 2016-05-07 | DRG: 064 | Disposition: A | Discharge status: Skilled Nursing Facility | Payer: Medicare (Managed Care) | Source: Other Acute Inpatient Hospital | Attending: Neurology | Admitting: Neurology

## 2016-04-28 ENCOUNTER — Inpatient Hospital Stay: Payer: Medicare (Managed Care)

## 2016-04-28 DIAGNOSIS — K7689 Other specified diseases of liver: Secondary | ICD-10-CM | POA: Diagnosis present

## 2016-04-28 DIAGNOSIS — R4182 Altered mental status, unspecified: Secondary | ICD-10-CM | POA: Diagnosis present

## 2016-04-28 DIAGNOSIS — I10 Essential (primary) hypertension: Secondary | ICD-10-CM | POA: Diagnosis present

## 2016-04-28 DIAGNOSIS — Z79899 Other long term (current) drug therapy: Secondary | ICD-10-CM | POA: Diagnosis not present

## 2016-04-28 DIAGNOSIS — Z85038 Personal history of other malignant neoplasm of large intestine: Secondary | ICD-10-CM

## 2016-04-28 DIAGNOSIS — G9340 Encephalopathy, unspecified: Secondary | ICD-10-CM | POA: Diagnosis present

## 2016-04-28 DIAGNOSIS — Z66 Do not resuscitate: Secondary | ICD-10-CM | POA: Diagnosis present

## 2016-04-28 DIAGNOSIS — I161 Hypertensive emergency: Secondary | ICD-10-CM | POA: Diagnosis present

## 2016-04-28 DIAGNOSIS — F0280 Dementia in other diseases classified elsewhere without behavioral disturbance: Secondary | ICD-10-CM | POA: Diagnosis present

## 2016-04-28 DIAGNOSIS — R4701 Aphasia: Secondary | ICD-10-CM | POA: Diagnosis present

## 2016-04-28 DIAGNOSIS — D72829 Elevated white blood cell count, unspecified: Secondary | ICD-10-CM | POA: Diagnosis not present

## 2016-04-28 DIAGNOSIS — G309 Alzheimer's disease, unspecified: Secondary | ICD-10-CM | POA: Diagnosis present

## 2016-04-28 DIAGNOSIS — R29810 Facial weakness: Secondary | ICD-10-CM | POA: Diagnosis present

## 2016-04-28 DIAGNOSIS — I739 Peripheral vascular disease, unspecified: Secondary | ICD-10-CM | POA: Diagnosis present

## 2016-04-28 DIAGNOSIS — E278 Other specified disorders of adrenal gland: Secondary | ICD-10-CM | POA: Diagnosis present

## 2016-04-28 DIAGNOSIS — I6350 Cerebral infarction due to unspecified occlusion or stenosis of unspecified cerebral artery: Secondary | ICD-10-CM | POA: Diagnosis not present

## 2016-04-28 DIAGNOSIS — Z7902 Long term (current) use of antithrombotics/antiplatelets: Secondary | ICD-10-CM | POA: Diagnosis not present

## 2016-04-28 DIAGNOSIS — Z7982 Long term (current) use of aspirin: Secondary | ICD-10-CM

## 2016-04-28 DIAGNOSIS — R29723 NIHSS score 23: Secondary | ICD-10-CM

## 2016-04-28 DIAGNOSIS — E785 Hyperlipidemia, unspecified: Secondary | ICD-10-CM | POA: Diagnosis present

## 2016-04-28 DIAGNOSIS — I129 Hypertensive chronic kidney disease with stage 1 through stage 4 chronic kidney disease, or unspecified chronic kidney disease: Secondary | ICD-10-CM | POA: Diagnosis present

## 2016-04-28 DIAGNOSIS — I610 Nontraumatic intracerebral hemorrhage in hemisphere, subcortical: Secondary | ICD-10-CM | POA: Diagnosis not present

## 2016-04-28 DIAGNOSIS — I619 Nontraumatic intracerebral hemorrhage, unspecified: Secondary | ICD-10-CM | POA: Diagnosis present

## 2016-04-28 DIAGNOSIS — I611 Nontraumatic intracerebral hemorrhage in hemisphere, cortical: Principal | ICD-10-CM | POA: Diagnosis present

## 2016-04-28 DIAGNOSIS — R509 Fever, unspecified: Secondary | ICD-10-CM | POA: Diagnosis not present

## 2016-04-28 DIAGNOSIS — Z8673 Personal history of transient ischemic attack (TIA), and cerebral infarction without residual deficits: Secondary | ICD-10-CM

## 2016-04-28 DIAGNOSIS — G936 Cerebral edema: Secondary | ICD-10-CM | POA: Diagnosis present

## 2016-04-28 DIAGNOSIS — I615 Nontraumatic intracerebral hemorrhage, intraventricular: Secondary | ICD-10-CM | POA: Diagnosis present

## 2016-04-28 DIAGNOSIS — N189 Chronic kidney disease, unspecified: Secondary | ICD-10-CM | POA: Diagnosis present

## 2016-04-28 DIAGNOSIS — N390 Urinary tract infection, site not specified: Secondary | ICD-10-CM

## 2016-04-28 DIAGNOSIS — N183 Chronic kidney disease, stage 3 (moderate): Secondary | ICD-10-CM | POA: Diagnosis present

## 2016-04-28 DIAGNOSIS — K5900 Constipation, unspecified: Secondary | ICD-10-CM | POA: Diagnosis present

## 2016-04-28 DIAGNOSIS — H53461 Homonymous bilateral field defects, right side: Secondary | ICD-10-CM | POA: Diagnosis present

## 2016-04-28 DIAGNOSIS — G8191 Hemiplegia, unspecified affecting right dominant side: Secondary | ICD-10-CM | POA: Diagnosis present

## 2016-04-28 DIAGNOSIS — Z4659 Encounter for fitting and adjustment of other gastrointestinal appliance and device: Secondary | ICD-10-CM

## 2016-04-28 DIAGNOSIS — E876 Hypokalemia: Secondary | ICD-10-CM | POA: Diagnosis present

## 2016-04-28 DIAGNOSIS — E279 Disorder of adrenal gland, unspecified: Secondary | ICD-10-CM

## 2016-04-28 LAB — CBC
HEMATOCRIT: 36.9 % (ref 35.0–47.0)
HEMOGLOBIN: 12.7 g/dL (ref 12.0–16.0)
MCH: 29.1 pg (ref 26.0–34.0)
MCHC: 34.5 g/dL (ref 32.0–36.0)
MCV: 84.4 fL (ref 80.0–100.0)
Platelets: 199 10*3/uL (ref 150–440)
RBC: 4.38 MIL/uL (ref 3.80–5.20)
RDW: 13.5 % (ref 11.5–14.5)
WBC: 10.8 10*3/uL (ref 3.6–11.0)

## 2016-04-28 LAB — BASIC METABOLIC PANEL
ANION GAP: 6 (ref 5–15)
BUN: 17 mg/dL (ref 6–20)
CALCIUM: 8.5 mg/dL — AB (ref 8.9–10.3)
CO2: 28 mmol/L (ref 22–32)
Chloride: 108 mmol/L (ref 101–111)
Creatinine, Ser: 0.82 mg/dL (ref 0.44–1.00)
Glucose, Bld: 128 mg/dL — ABNORMAL HIGH (ref 65–99)
POTASSIUM: 3.1 mmol/L — AB (ref 3.5–5.1)
Sodium: 142 mmol/L (ref 135–145)

## 2016-04-28 LAB — MRSA PCR SCREENING: MRSA by PCR: NEGATIVE

## 2016-04-28 MED ORDER — CLONIDINE HCL 0.2 MG PO TABS
0.2000 mg | ORAL_TABLET | ORAL | Status: DC
  Filled 2016-04-28: qty 1

## 2016-04-28 MED ORDER — ACETAMINOPHEN 325 MG PO TABS
650.0000 mg | ORAL_TABLET | ORAL | Status: DC | PRN
  Administered 2016-04-30 – 2016-05-06 (×6): 650 mg via ORAL
  Filled 2016-04-28 (×6): qty 2

## 2016-04-28 MED ORDER — CLOPIDOGREL BISULFATE 75 MG PO TABS: 75.0000 mg | ORAL_TABLET | Freq: Every day | ORAL | Status: DC

## 2016-04-28 MED ORDER — CEFTRIAXONE SODIUM-DEXTROSE 2-2.22 GM-% IV SOLR
2.0000 g | Freq: Once | INTRAVENOUS | Status: AC
  Administered 2016-04-28: 2 g via INTRAVENOUS
  Filled 2016-04-28: qty 50

## 2016-04-28 MED ORDER — ACETAMINOPHEN 650 MG RE SUPP
650.0000 mg | RECTAL | Status: DC | PRN
  Administered 2016-04-28 – 2016-05-02 (×5): 650 mg via RECTAL
  Filled 2016-04-28 (×6): qty 1

## 2016-04-28 MED ORDER — FELODIPINE ER 10 MG PO TB24: 10.0000 mg | ORAL_TABLET | Freq: Every day | ORAL | Status: DC

## 2016-04-28 MED ORDER — FELODIPINE ER 5 MG PO TB24: 10.0000 mg | ORAL_TABLET | Freq: Every day | ORAL | Status: DC

## 2016-04-28 MED ORDER — ONDANSETRON HCL 4 MG/2ML IJ SOLN: 4.0000 mg | Freq: Four times a day (QID) | INTRAMUSCULAR | Status: DC | PRN

## 2016-04-28 MED ORDER — LABETALOL HCL 5 MG/ML IV SOLN
20.0000 mg | Freq: Once | INTRAVENOUS | Status: AC
  Administered 2016-04-28: 20 mg via INTRAVENOUS
  Filled 2016-04-28: qty 4

## 2016-04-28 MED ORDER — LABETALOL HCL 5 MG/ML IV SOLN
10.0000 mg | Freq: Four times a day (QID) | INTRAVENOUS | Status: DC
Start: 2016-04-28 — End: 2016-04-28
  Filled 2016-04-28 (×3): qty 4

## 2016-04-28 MED ORDER — ACETAMINOPHEN 650 MG RE SUPP: 650.0000 mg | Freq: Four times a day (QID) | RECTAL | 0 refills | Status: DC | PRN

## 2016-04-28 MED ORDER — FAMOTIDINE 20 MG PO TABS: 20.0000 mg | ORAL_TABLET | Freq: Every day | ORAL | Status: DC

## 2016-04-28 MED ORDER — CLONIDINE HCL 0.2 MG PO TABS: 0.2000 mg | ORAL_TABLET | ORAL | 0 refills | Status: DC

## 2016-04-28 MED ORDER — OXYCODONE HCL 5 MG PO TABS: 5.0000 mg | ORAL_TABLET | ORAL | Status: DC | PRN

## 2016-04-28 MED ORDER — HYDRALAZINE HCL 20 MG/ML IJ SOLN
10.0000 mg | Freq: Four times a day (QID) | INTRAMUSCULAR | Status: DC
  Administered 2016-04-28: 10 mg via INTRAVENOUS
  Filled 2016-04-28: qty 1

## 2016-04-28 MED ORDER — PANTOPRAZOLE SODIUM 40 MG IV SOLR
40.0000 mg | Freq: Every day | INTRAVENOUS | Status: DC
  Administered 2016-04-28 – 2016-04-30 (×3): 40 mg via INTRAVENOUS
  Filled 2016-04-28 (×3): qty 40

## 2016-04-28 MED ORDER — SENNOSIDES-DOCUSATE SODIUM 8.6-50 MG PO TABS
1.0000 | ORAL_TABLET | Freq: Two times a day (BID) | ORAL | Status: DC
  Administered 2016-04-30 – 2016-05-07 (×15): 1 via ORAL
  Filled 2016-04-28 (×15): qty 1

## 2016-04-28 MED ORDER — ONDANSETRON HCL 4 MG PO TABS: 4.0000 mg | ORAL_TABLET | Freq: Four times a day (QID) | ORAL | 0 refills | Status: DC | PRN

## 2016-04-28 MED ORDER — MEMANTINE HCL 5 MG PO TABS: 5.0000 mg | ORAL_TABLET | Freq: Every day | ORAL | Status: DC

## 2016-04-28 MED ORDER — ONDANSETRON HCL 4 MG PO TABS: 4.0000 mg | ORAL_TABLET | Freq: Four times a day (QID) | ORAL | Status: DC | PRN

## 2016-04-28 MED ORDER — STROKE: EARLY STAGES OF RECOVERY BOOK
Freq: Once | Status: AC
  Administered 2016-04-28: 22:00:00
  Filled 2016-04-28: qty 1

## 2016-04-28 MED ORDER — CEFTRIAXONE SODIUM-DEXTROSE 2-2.22 GM-% IV SOLR
2.0000 g | INTRAVENOUS | Status: DC
  Filled 2016-04-28: qty 50

## 2016-04-28 MED ORDER — HYDRALAZINE HCL 20 MG/ML IJ SOLN: 10.0000 mg | Freq: Four times a day (QID) | INTRAMUSCULAR | Status: DC | PRN

## 2016-04-28 MED ORDER — ENOXAPARIN SODIUM 40 MG/0.4ML ~~LOC~~ SOLN: 40.0000 mg | SUBCUTANEOUS | Status: DC

## 2016-04-28 MED ORDER — ACETAMINOPHEN 650 MG RE SUPP
650.0000 mg | Freq: Four times a day (QID) | RECTAL | Status: DC | PRN
  Administered 2016-04-28: 650 mg via RECTAL

## 2016-04-28 MED ORDER — CEFTRIAXONE SODIUM-DEXTROSE 2-2.22 GM-% IV SOLR: 2.0000 g | INTRAVENOUS | Status: DC

## 2016-04-28 MED ORDER — SODIUM CHLORIDE 3 % IV SOLN: 30.0000 mL/h | INTRAVENOUS | 0 refills | Status: DC

## 2016-04-28 MED ORDER — POTASSIUM CHLORIDE IN NACL 40-0.9 MEQ/L-% IV SOLN
INTRAVENOUS | Status: DC
  Administered 2016-04-28: 75 mL/h via INTRAVENOUS
  Filled 2016-04-28: qty 1000

## 2016-04-28 MED ORDER — ASPIRIN EC 81 MG PO TBEC: 81.0000 mg | DELAYED_RELEASE_TABLET | Freq: Every day | ORAL | Status: DC

## 2016-04-28 MED ORDER — SODIUM CHLORIDE 0.9 % IV SOLN
INTRAVENOUS | Status: DC
  Administered 2016-04-28 – 2016-05-06 (×6): via INTRAVENOUS

## 2016-04-28 MED ORDER — ACETAMINOPHEN 325 MG PO TABS: 650.0000 mg | ORAL_TABLET | Freq: Four times a day (QID) | ORAL | Status: DC | PRN

## 2016-04-28 MED ORDER — LABETALOL HCL 5 MG/ML IV SOLN: 10.0000 mg | Freq: Four times a day (QID) | INTRAVENOUS | 0 refills | Status: DC

## 2016-04-28 MED ORDER — HYDRALAZINE HCL 20 MG/ML IJ SOLN: 10.0000 mg | Freq: Four times a day (QID) | INTRAMUSCULAR | 0 refills | Status: DC

## 2016-04-28 MED ORDER — SODIUM CHLORIDE 3 % IV SOLN: INTRAVENOUS | Status: DC

## 2016-04-28 MED ORDER — ATENOLOL 25 MG PO TABS: 50.0000 mg | ORAL_TABLET | Freq: Every day | ORAL | Status: DC

## 2016-04-28 MED ORDER — SODIUM CHLORIDE 0.9 % IV SOLN
INTRAVENOUS | Status: DC
  Administered 2016-04-28: 02:00:00 via INTRAVENOUS

## 2016-04-28 MED ORDER — CLEVIDIPINE BUTYRATE 0.5 MG/ML IV EMUL
0.0000 mg/h | INTRAVENOUS | Status: DC
  Administered 2016-04-29: 4 mg/h via INTRAVENOUS
  Administered 2016-04-29: 3 mg/h via INTRAVENOUS
  Administered 2016-04-29: 1 mg/h via INTRAVENOUS
  Administered 2016-04-29 (×2): 3 mg/h via INTRAVENOUS
  Administered 2016-04-30: 2 mg/h via INTRAVENOUS
  Administered 2016-04-30: 4 mg/h via INTRAVENOUS
  Administered 2016-05-01: 1 mg/h via INTRAVENOUS
  Administered 2016-05-02: 3 mg/h via INTRAVENOUS
  Administered 2016-05-03: 1 mg/h via INTRAVENOUS
  Filled 2016-04-28 (×11): qty 50

## 2016-04-28 MED ORDER — ACETAMINOPHEN 160 MG/5ML PO SOLN
650.0000 mg | ORAL | Status: DC | PRN
  Administered 2016-05-04 (×2): 650 mg
  Filled 2016-04-28 (×2): qty 20.3

## 2016-04-28 NOTE — Progress Notes (Signed)
Neurology:  Case d/w Dr. Posey Pronto over the phone.  CTH done for new weakness.  Pt has significant L parietal hemorrhage with minimal intraventricular extension.    ICH score is 3 for age, IVH, size.    There is slight midline shift. Will hold off osmotic therapy at this point.  If family wants aggressive care, pt needs to be transferred to to Neurological ICU at [facility].  Please call with questions.

## 2016-04-28 NOTE — Progress Notes (Signed)
Pharmacy Antibiotic Note  [name] is a 82 y.o. female admitted on 04/27/2016 with UTI.  Pharmacy has been consulted for ceftriaxone dosing.  Plan: Ceftriaxone 2 grams q 24 hours ordered.  Height: 5\' 6"  (167.6 cm) Weight: 165 lb (74.8 kg) IBW/kg (Calculated) : 59.3  Temp (24hrs), Avg:99.3 F (37.4 C), Min:98.5 F (36.9 C), Max:99.8 F (37.7 C)   Recent Labs Lab 04/27/16 1810 04/27/16 1943 04/27/16 2240  WBC 10.6  --   --   CREATININE 0.77  --   --   LATICACIDVEN  --  2.3* 1.4    Estimated Creatinine Clearance: 52.2 mL/min (by C-G formula based on SCr of 0.77 mg/dL).    No Known Allergies  Antimicrobials this admission: ceftriaxone  >>    >>   Dose adjustments this admission:   Microbiology results: 12/29 UCx: pending     12/29  UA: LE(tr)  NO2(-) WBC 6-30  Thank you for allowing pharmacy to be a part of this patient's care.  Eileen Croswell S 04/28/2016 2:37 AM

## 2016-04-28 NOTE — Evaluation (Signed)
Clinical/Bedside Swallow Evaluation Patient Details  Name: [name] MRN: [id number] Date of Birth: [birth year]  Today's Date: 04/28/2016 Time: SLP Start Time (ACUTE ONLY): 0940 SLP Stop Time (ACUTE ONLY): 1010 SLP Time Calculation (min) (ACUTE ONLY): 30 min  Past Medical History:  Past Medical History:  Diagnosis Date  . Alzheimer's dementia   . Anemia   . CKD (chronic kidney disease)    stage 3  . Colon cancer ([place])   . Constipation   . CVA (cerebral vascular accident) ([place])   . Dementia   . Hypertension   . PVD (peripheral vascular disease) ([place])   . UTI (urinary tract infection)    Past Surgical History:  Past Surgical History:  Procedure Laterality Date  . NO PAST SURGERIES     HPI:      Assessment / Plan / Recommendation Clinical Impression  pt presents with a severe to profound oral pharyngeal dysphagia characterized by oral holding of all trials of puree and honey think liquid administered.   pt did not cough or throat clear post swallows but slp has significant concerns for silent aspiration due to status post intake. slp recommends NPO at this time. Nsg and MD notified.  pt was lethargic upon initial slp arrival but was able to be aroused with assistance and wet wash cloth. Pt unable to follow simple commands. Pt refused intake of further trials.     Aspiration Risk  Severe aspiration risk    Diet Recommendation NPO        Other  Recommendations Oral Care Recommendations: Oral care BID Other Recommendations: Remove water pitcher   Follow up Recommendations        Frequency and Duration min 3x week  2 weeks       Prognosis Prognosis for Safe Diet Advancement: Guarded Barriers to Reach Goals: Cognitive deficits      Swallow Study   General Date of Onset: 04/28/16 Type of Study: Bedside Swallow Evaluation Diet Prior to this Study: Regular;Thin liquids Temperature Spikes Noted: No Respiratory Status: Room air History of Recent Intubation:  No Behavior/Cognition: Uncooperative;Lethargic/Drowsy;Requires cueing;Doesn't follow directions Oral Cavity Assessment: Dry Oral Care Completed by SLP: No Oral Cavity - Dentition: Edentulous Vision: Functional for self-feeding Self-Feeding Abilities: Total assist Patient Positioning: Upright in bed;Partially reclined Baseline Vocal Quality: Not observed Volitional Cough: Cognitively unable to elicit Volitional Swallow: Unable to elicit    Oral/Motor/Sensory Function     Ice Chips Ice chips: Not tested   Thin Liquid Thin Liquid: Not tested    Nectar Thick Nectar Thick Liquid: Not tested   Honey Thick Honey Thick Liquid: Impaired Presentation: Spoon Oral Phase Impairments: Reduced labial seal;Reduced lingual movement/coordination;Impaired mastication;Poor awareness of bolus Oral Phase Functional Implications: Prolonged oral transit;Oral holding Pharyngeal Phase Impairments: Suspected delayed Swallow;Decreased hyoid-laryngeal movement   Puree Puree: Impaired Presentation: Spoon Oral Phase Impairments: Reduced labial seal;Reduced lingual movement/coordination;Impaired mastication;Poor awareness of bolus Oral Phase Functional Implications: Oral holding Pharyngeal Phase Impairments: Decreased hyoid-laryngeal movement;Suspected delayed Swallow   Solid   GO   Solid: Not tested        [place] Sauber 04/28/2016,12:09 PM

## 2016-04-28 NOTE — Progress Notes (Signed)
Pt unresponsive. D/C diet pending swallow evaluation.

## 2016-04-28 NOTE — Clinical Social Work Note (Signed)
CSW attempted to contact caregiver/daughter Amy to discuss dc planning. Patient's daughter did not answer the phone; CSW left voicemail.  Santiago Bumpers, MSW, LCSW-A [phone]

## 2016-04-28 NOTE — Care Management Note (Signed)
Case Management Note  Patient Details  Name: LATYSHA GUERRETTE MRN: [id number] Date of Birth: July 31, 1927  Subjective/Objective:      Call to [facility] to update them that Ms Brickel is being transferred to [facility]. Was told that the on-call nurse would call me back on Tuesday unless this was an emergency.              Action/Plan:   Expected Discharge Date:                  Expected Discharge Plan:     In-House Referral:     Discharge planning Services     Post Acute Care Choice:    Choice offered to:     DME Arranged:    DME Agency:     HH Arranged:    HH Agency:     Status of Service:     If discussed at [employer] of Stay Meetings, dates discussed:    Additional Comments:  Tyrell Brereton A, RN 04/28/2016, 4:45 PM

## 2016-04-28 NOTE — Discharge Summary (Signed)
[name], 82 y.o., DOB 10-03-1927, MRN [id number]. Admission date: 04/27/2016 Discharge Date 04/28/2016 Primary MD Gareth Morgan, MD Admitting Physician Lance Coon, MD  Admission Diagnosis  Encephalopathy [G93.40] Elevated lactic acid level [R79.89] Urinary tract infection without hematuria, site unspecified [N39.0] Constipation, unspecified constipation type [K59.00]  Discharge Diagnosis   Principal Problem:  Intracranial hemorrhage with midline shift Acute encephalopathy Severe constipation Alzheimer's dementia Chronic kidney disease stage III Colon cancer Previous history of CVA Accelerated hypertension      Hospital Course  [name]  is a 82 y.o. female who presents with Abdominal pain and UTI. Patient had a UA performed earlier at her facility and was found to be nitrite positive. She was given IM antibiotics for the same. However she continued to have abdominal pain based on her symptoms and had a few episodes of vomiting. Patient with dementia and is normally nonverbal. Who was not responding this morning. Therefore CT scan of the head was done which showed a large intracranial hemorrhage with midline shift. I discussed the case with neurology Dr. Jesusita Oka as well as on call neurosurgeon at [facility]. Who recommended that there would be no surgery. Dr. Louis Meckel stated that patient needed to be where neuro services could be provided therefore he contacted Dr. Cheral Marker who has accepted the patient in transfer. I have updated patient's daughter multiple times. Regarding her condition and plan of transfer.             Consults  neurology  Significant Tests:  See full reports for all details     Ct Head Wo Contrast  Addendum Date: 04/28/2016   ADDENDUM REPORT: 04/28/2016 15:05 ADDENDUM: Phone contact completed at 1455 hours. Electronically Signed   By: Nelson Chimes M.D.   On: 04/28/2016 15:05    Result Date: 04/28/2016 CLINICAL DATA:  Dementia.  Unresponsive this morning. EXAM: CT HEAD WITHOUT CONTRAST TECHNIQUE: Contiguous axial images were obtained from the base of the skull through the vertex without intravenous contrast. COMPARISON:  02/18/2015 FINDINGS: Brain: There is an acute intraparenchymal hematoma with the epicenter in the left parietal lobe measuring 7.8 x 3.6 x 6.6 cm (volume = 97 cm^3). There is a small amount of surrounding edema. There is intraventricular penetration with some layering blood evident in the occipital horn of the right lateral ventricle. There is mass effect with flattening of the posterior aspect of the left lateral ventricle and left-to-right shift of 3 mm. Elsewhere, the brain shows atrophy and chronic small vessel ischemic change. Vascular: There is atherosclerotic calcification of the major vessels at the base of the brain. Skull: No skull fracture. Sinuses/Orbits: Layering fluid in the right division of the sphenoid sinus. Other sinuses clear. Orbits negative. Other: None IMPRESSION: Acute intraparenchymal hemorrhage in the left parietal lobe with hematoma measuring 7.8 x 3.6 x 6.6 cm, 97 cc volume. Intraventricular penetration with a small amount of blood dependent in the lateral ventricle on the right. Mass effect with flattening of the posterior aspect of the left lateral ventricle and 3 mm of left-to-right shift. Critical Value/emergent results were called by telephone PA age at the time of interpretation on 04/28/2016 at 2:35 pm to Dr. Chana Bode Marlos Carmen. Call not yet returned. Electronically Signed: By: Nelson Chimes M.D. On: 04/28/2016 14:44   Ct Abdomen Pelvis W Contrast  Result Date: 04/27/2016 CLINICAL DATA:  Emesis and abdominal pain.  Altered mental status. EXAM: CT ABDOMEN AND PELVIS WITH CONTRAST  TECHNIQUE: Multidetector CT imaging of the abdomen and pelvis was performed using the standard protocol following bolus administration of intravenous  contrast. CONTRAST:  164mL ISOVUE-300 IOPAMIDOL (ISOVUE-300) INJECTION 61% COMPARISON:  None. FINDINGS: Lower chest: No acute abnormality allowing for motion. Tortuous atherosclerotic distal thoracic aorta. Heart is at the upper limits of normal in size. Hepatobiliary: There is a lamellated 2.9 cm gallstone within the gallbladder, no evidence of pericholecystic inflammation. No biliary dilatation. Scattered small subcentimeter hepatic hypodensities are too small to accurately characterize, likely cysts. A different subcapsular ill-defined hypodensity in segment 6 measuring 2.2 cm is of uncertain etiology. Pancreas: Atrophic parenchyma. No ductal dilatation or inflammation. Spleen: Normal in size without focal abnormality. Adrenals/Urinary Tract: There is an exophytic 2.4 cm nodule from the left adrenal gland. This is homogeneous in density with well-circumscribed borders. Right adrenal gland is normal. There is no hydronephrosis. Right kidney slightly ptotic in appearance with an extrarenal pelvis configuration. Scattered cortical hypodensities throughout both kidneys, majority too small to characterize, however likely simple cysts. There is a complex cysts in the right upper kidney without enhancement it measures 12 mm. No suspicious renal lesion. Ureters are decompressed. Urinary bladder is minimally distended, and displaced anteriorly by rectal stool. Stomach/Bowel: Stomach physiologically distended with fluid. Question of wall thickening in the para pyloric stomach and proximal duodenum. Remaining small bowel is decompressed. No small bowel thickening or inflammation. There is moderate stool throughout the colon with colonic tortuosity. There is a small umbilical hernia that contains nonobstructed of the small bowel and anti mesenteric border of transverse colon, no associated wall thickening or inflammation. A stool ball distends the rectum spanning 7.9 cm. There is mild wall thickening of the distal most  sigmoid colon and rectum. Chain sutures at the bases cecum, likely from appendectomy, the appendix is not visualized. Vascular/Lymphatic: Atherosclerosis and tortuosity of the abdominal aorta and its branches. No aneurysm. No retroperitoneal, mesenteric or pelvic adenopathy. Reproductive: Status post hysterectomy. No adnexal masses. Other: No free air, free fluid, or intra-abdominal fluid collection. Musculoskeletal: Linear air within the right gluteal musculature. Felt to be within veins secondary to IV catheter placement. The bones are under mineralized. Chronic appearing compression deformities of L1, L2, and L4 superior endplates. Multilevel degenerative change throughout spine. Intramedullary rod in the right femur is partially included. IMPRESSION: 1. Moderate stool burden with rectal distention, concerning for fecal impaction. No bowel obstruction. 2. Small umbilical hernia contains noninflamed nonobstructed loop of small bowel and anti mesenteric border of transverse colon. 3. Gallstone without gallbladder inflammation. 4. **An incidental finding of potential clinical significance has been found. Left adrenal nodule measures 2.4 cm. There are no prior exams for comparison. In the absence of malignancy history, recommend adrenal protocol CT. ** 5. **An incidental finding of potential clinical significance has been found. Ill-defined 2.2 cm hypodense lesion in the right lobe of the liver. Recommend nonemergent hepatic protocol MRI, preferably when patient is able tolerate breath hold technique.** multiple additional subcentimeter hepatic lesions are too small to characterize. 6. Atherosclerosis including coronary artery calcifications. 7. Multiple remote lumbar spine compression fractures. Electronically Signed   By: Jeb Levering M.D.   On: 04/27/2016 21:33   Dg Chest Portable 1 View  Result Date: 04/27/2016 CLINICAL DATA:  Acute onset of elevated lactic acid. Vomiting. Initial encounter. EXAM:  PORTABLE CHEST 1 VIEW COMPARISON:  Chest radiograph performed 02/17/2015 FINDINGS: The lungs are well-aerated and clear. There is no evidence of focal opacification, pleural effusion or pneumothorax. The cardiomediastinal silhouette is  within normal limits. No acute osseous abnormalities are seen. Loose bodies are noted 1 at the right joint space. IMPRESSION: No acute cardiopulmonary process seen. Electronically Signed   By: Garald Balding M.D.   On: 04/27/2016 23:20       Today   Subjective:    Objective:   Blood pressure (!) 166/75, pulse 92, temperature 99.3 F (37.4 C), temperature source Axillary, resp. rate 18, height 5\' 6"  (1.676 m), weight 124 lb 9.6 oz (56.5 kg), SpO2 100 %.  .  Intake/Output Summary (Last 24 hours) at 04/28/16 1618 Last data filed at 04/28/16 0400  Gross per 24 hour  Intake           113.75 ml  Output                0 ml  Net           113.75 ml         Code Status Orders        Start     Ordered   04/28/16 0209  Limited resuscitation (code)  Continuous    Question Answer Comment  In the event of cardiac or respiratory ARREST: Initiate Code Blue, Call Rapid Response Yes   In the event of cardiac or respiratory ARREST: Perform CPR Yes   In the event of cardiac or respiratory ARREST: Perform Intubation/Mechanical Ventilation No   In the event of cardiac or respiratory ARREST: Use NIPPV/BiPAp only if indicated Yes   In the event of cardiac or respiratory ARREST: Administer ACLS medications if indicated Yes   In the event of cardiac or respiratory ARREST: Perform Defibrillation or Cardioversion if indicated Yes      04/28/16 0208    Code Status History    Date Active Date Inactive Code Status Order ID Comments User Context   This patient has a current code status but no historical code status.            Discharge Medications   Allergies as of 04/28/2016   No Known Allergies     Medication List    STOP taking these medications    ranitidine 150 MG tablet Commonly known as:  ZANTAC Replaced by:  famotidine 20 MG tablet     TAKE these medications   acetaminophen 325 MG tablet Commonly known as:  TYLENOL Take 2 tablets (650 mg total) by mouth every 6 (six) hours as needed for mild pain (or Fever >/= 101).   acetaminophen 650 MG suppository Commonly known as:  TYLENOL Place 1 suppository (650 mg total) rectally every 6 (six) hours as needed for mild pain (or Fever >/= 101).   aspirin EC 81 MG tablet Take 81 mg by mouth daily.   atenolol 50 MG tablet Commonly known as:  TENORMIN Take 50 mg by mouth daily.   azelastine 0.1 % nasal spray Commonly known as:  ASTELIN Place 1 spray into both nostrils 2 (two) times daily. Use in each nostril as directed   CALCIUM 600+D3 600-400 MG-UNIT Tabs Generic drug:  Calcium Carbonate-Vitamin D3 Take 1 tablet by mouth 2 (two) times daily.   cefTRIAXone 2-2.22 GM-% IVPB Commonly known as:  ROCEPHIN Inject 50 mLs (2 g total) into the vein daily. Start taking on:  04/29/2016   cloNIDine 0.2 MG tablet Commonly known as:  CATAPRES Take 1 tablet (0.2 mg total) by mouth stat.   clopidogrel 75 MG tablet Commonly known as:  PLAVIX Take 75 mg by mouth daily.  famotidine 20 MG tablet Commonly known as:  PEPCID Take 1 tablet (20 mg total) by mouth daily. Replaces:  ranitidine 150 MG tablet   felodipine 10 MG 24 hr tablet Commonly known as:  PLENDIL Take 1 tablet (10 mg total) by mouth daily.   guaiFENesin-dextromethorphan 100-10 MG/5ML syrup Commonly known as:  ROBITUSSIN DM Take 10 mLs by mouth every 4 (four) hours as needed for cough.   hydrALAZINE 20 MG/ML injection Commonly known as:  APRESOLINE Inject 0.5 mLs (10 mg total) into the vein every 6 (six) hours.   hydrochlorothiazide 12.5 MG capsule Commonly known as:  MICROZIDE Take 12.5 mg by mouth daily.   labetalol 5 MG/ML injection Commonly known as:  NORMODYNE,TRANDATE Inject 2 mLs (10 mg total) into  the vein every 6 (six) hours.   loratadine 10 MG tablet Commonly known as:  CLARITIN Take 10 mg by mouth daily.   magnesium hydroxide 400 MG/5ML suspension Commonly known as:  MILK OF MAGNESIA Take 10 mLs by mouth daily as needed for mild constipation.   memantine 5 MG tablet Commonly known as:  NAMENDA Take 1 tablet (5 mg total) by mouth daily.   ondansetron 4 MG tablet Commonly known as:  ZOFRAN Take 1 tablet (4 mg total) by mouth every 6 (six) hours as needed for nausea.   potassium chloride SA 20 MEQ tablet Commonly known as:  K-DUR,KLOR-CON Take 20 mEq by mouth daily.   senna-docusate 8.6-50 MG tablet Commonly known as:  Senokot-S Take 1 tablet by mouth daily.   sodium chloride (hypertonic) 3 % solution Inject 30 mL/hr into the vein continuous.   traZODone 100 MG tablet Commonly known as:  DESYREL Take 100 mg by mouth at bedtime. Take 2 tablets PO at bedtime.          Total Time in preparing paper work, data evaluation and todays exam - 35 minutes  Dustin Flock M.D on 04/28/2016 at [place] PM  [facility]   Office  [phone]

## 2016-04-28 NOTE — H&P (Signed)
Admission H&P    Chief Complaint: acute intracerebral and  Intraventricular hemorrhage.  HPI: [name] is an 82 y.o. female with a history of hypertension, dementia, chronic kidney disease, previous stroke , peripheral vascular disease and urinary tract  Infections, transferred from [facility] after CT scan of the head was obtained which showed a large left intracerebral hemorrhage with ventricular extension and mild mass effect. Onset is unclear. She was seen initially at [facility] on the morning of 04/27/2016 for altered mental status and thought to have recurrent urinary tract infection. She was given IM antibiotics.She was brought back to [facility] the same afternoon after vomiting blood and was admitted. Patient has been taking aspirin 81 mg per day. CT scan of her head was obtained because of persistent mental status changes. Study showed a large left parietal intraparenchymal hemorrhage with ventricular extension. Volume was 97 cm. Patient had premorbid speech abnormality with minimal speech output. Right hemiplegia is new. NIH stroke score was 21.  LSN: 04/26/2016 tPA Given: No: acute ICH mRankin:  Past Medical History:  Diagnosis Date  . Alzheimer's dementia   . Anemia   . CKD (chronic kidney disease)    stage 3  . Colon cancer ([place])   . Constipation   . CVA (cerebral vascular accident) ([place])   . Dementia   . Hypertension   . PVD (peripheral vascular disease) ([place])   . UTI (urinary tract infection)     Past Surgical History:  Procedure Laterality Date  . NO PAST SURGERIES      Family History  Problem Relation Age of Onset  . Family history unknown: Yes   Social History:  reports that she has never smoked. She has never used smokeless tobacco. She reports that she does not drink alcohol or use drugs.  Allergies: No Known Allergies  Medications Prior to Admission  Medication Sig Dispense Refill  . acetaminophen (TYLENOL) 325 MG tablet Take 2 tablets (650 mg total) by mouth every  6 (six) hours as needed for mild pain (or Fever >/= 101).    Marland Kitchen acetaminophen (TYLENOL) 650 MG suppository Place 1 suppository (650 mg total) rectally every 6 (six) hours as needed for mild pain (or Fever >/= 101). 12 suppository 0  . aspirin EC 81 MG tablet Take 81 mg by mouth daily.    Marland Kitchen atenolol (TENORMIN) 50 MG tablet Take 50 mg by mouth daily.    Marland Kitchen azelastine (ASTELIN) 0.1 % nasal spray Place 1 spray into both nostrils 2 (two) times daily. Use in each nostril as directed    . Calcium Carbonate-Vitamin D3 (CALCIUM 600+D3) 600-400 MG-UNIT TABS Take 1 tablet by mouth 2 (two) times daily.    Derrill Memo ON 04/29/2016] cefTRIAXone (ROCEPHIN) 2-2.22 GM-% IVPB Inject 50 mLs (2 g total) into the vein daily. 1 each   . cloNIDine (CATAPRES) 0.2 MG tablet Take 1 tablet (0.2 mg total) by mouth stat. 1 tablet 0  . clopidogrel (PLAVIX) 75 MG tablet Take 75 mg by mouth daily.    . famotidine (PEPCID) 20 MG tablet Take 1 tablet (20 mg total) by mouth daily.    . felodipine (PLENDIL) 10 MG 24 hr tablet Take 1 tablet (10 mg total) by mouth daily.    Marland Kitchen guaiFENesin-dextromethorphan (ROBITUSSIN DM) 100-10 MG/5ML syrup Take 10 mLs by mouth every 4 (four) hours as needed for cough.    . hydrALAZINE (APRESOLINE) 20 MG/ML injection Inject 0.5 mLs (10 mg total) into the vein every 6 (six) hours. 1 mL  0  . hydrochlorothiazide (MICROZIDE) 12.5 MG capsule Take 12.5 mg by mouth daily.    Marland Kitchen labetalol (NORMODYNE,TRANDATE) 5 MG/ML injection Inject 2 mLs (10 mg total) into the vein every 6 (six) hours. 20 mL 0  . loratadine (CLARITIN) 10 MG tablet Take 10 mg by mouth daily.    . magnesium hydroxide (MILK OF MAGNESIA) 400 MG/5ML suspension Take 10 mLs by mouth daily as needed for mild constipation.    . memantine (NAMENDA) 5 MG tablet Take 1 tablet (5 mg total) by mouth daily.    . ondansetron (ZOFRAN) 4 MG tablet Take 1 tablet (4 mg total) by mouth every 6 (six) hours as needed for nausea. 20 tablet 0  . potassium chloride SA  (K-DUR,KLOR-CON) 20 MEQ tablet Take 20 mEq by mouth daily.    Marland Kitchen senna-docusate (SENOKOT-S) 8.6-50 MG tablet Take 1 tablet by mouth daily.    . sodium chloride, hypertonic, 3 % solution Inject 30 mL/hr into the vein continuous. 500 mL 0  . traZODone (DESYREL) 100 MG tablet Take 100 mg by mouth at bedtime. Take 2 tablets PO at bedtime.      ROS: Unavailable as patient is globally aphasic.  Physical Examination: Blood pressure (!) 141/61, pulse 99, temperature (!) 102 F (38.9 C), temperature source Axillary, resp. rate 19, height '5\' 4"'  (1.626 m), weight 56.7 kg (125 lb), SpO2 98 %.  HEENT-  Normocephalic, no lesions, without obvious abnormality.  Normal external eye and conjunctiva.  Normal TM's bilaterally.  Normal auditory canals and external ears. Normal external nose, mucus membranes and septum.  Normal pharynx. Neck supple with no masses, nodes, nodules or enlargement. Cardiovascular - regular rate and rhythm, S1, S2 normal, no murmur, click, rub or gallop Lungs - chest clear, no wheezing, rales, normal symmetric air entry Abdomen - soft, non-tender; bowel sounds normal; no masses,  no organomegaly Extremities - no joint deformities, effusion, or inflammation  Neurologic Examination: Mental Status: Alert, no speech output and unable to follow verbal commands. She had visual tracking on the left side but completely neglected her right side. She was in no acute distress. Cranial Nerves: II-dense right homonymous hemianopsia. III/IV/VI-Pupils were equal and reacted. Extraocular movements were limited to midline and  conjugately to the left side.    VII-mild right lower facial weakness. VIII-normal. X-no speech output. Motor: hemiplegia with moderate spasticity; normal strength and tone of left extremities. Sensory: absent response to tactile sensati. Deep Tendon Reflexes: 2+ and symmetric. Plantars: Flexor on the left and extensor on the right. Carotid auscultation: Normal  Results  for orders placed or performed during the hospital encounter of 04/27/16 (from the past 48 hour(s))  Lipase, blood     Status: None   Collection Time: 04/27/16  6:10 PM  Result Value Ref Range   Lipase 19 11 - 51 U/L  Comprehensive metabolic panel     Status: Abnormal   Collection Time: 04/27/16  6:10 PM  Result Value Ref Range   Sodium 138 135 - 145 mmol/L   Potassium 3.0 (L) 3.5 - 5.1 mmol/L   Chloride 100 (L) 101 - 111 mmol/L   CO2 26 22 - 32 mmol/L   Glucose, Bld 143 (H) 65 - 99 mg/dL   BUN 19 6 - 20 mg/dL   Creatinine, Ser 0.77 0.44 - 1.00 mg/dL   Calcium 9.3 8.9 - 10.3 mg/dL   Total Protein 7.3 6.5 - 8.1 g/dL   Albumin 3.8 3.5 - 5.0 g/dL   AST 38 15 -  41 U/L   ALT 16 14 - 54 U/L   Alkaline Phosphatase 74 38 - 126 U/L   Total Bilirubin 0.5 0.3 - 1.2 mg/dL   GFR calc non Af Amer >60 >60 mL/min   GFR calc Af Amer >60 >60 mL/min    Comment: (NOTE) The eGFR has been calculated using the CKD EPI equation. This calculation has not been validated in all clinical situations. eGFR's persistently <60 mL/min signify possible Chronic Kidney Disease.    Anion gap 12 5 - 15  CBC     Status: None   Collection Time: 04/27/16  6:10 PM  Result Value Ref Range   WBC 10.6 3.6 - 11.0 K/uL   RBC 4.95 3.80 - 5.20 MIL/uL   Hemoglobin 14.4 12.0 - 16.0 g/dL   HCT 42.1 35.0 - 47.0 %   MCV 85.1 80.0 - 100.0 fL   MCH 29.1 26.0 - 34.0 pg   MCHC 34.2 32.0 - 36.0 g/dL   RDW 13.4 11.5 - 14.5 %   Platelets 216 150 - 440 K/uL  Urinalysis, Complete w Microscopic     Status: Abnormal   Collection Time: 04/27/16  7:17 PM  Result Value Ref Range   Color, Urine YELLOW (A) YELLOW   APPearance CLEAR (A) CLEAR   Specific Gravity, Urine 1.017 1.005 - 1.030   pH 6.0 5.0 - 8.0   Glucose, UA 150 (A) NEGATIVE mg/dL   Hgb urine dipstick NEGATIVE NEGATIVE   Bilirubin Urine NEGATIVE NEGATIVE   Ketones, ur 20 (A) NEGATIVE mg/dL   Protein, ur 30 (A) NEGATIVE mg/dL   Nitrite NEGATIVE NEGATIVE   Leukocytes, UA  TRACE (A) NEGATIVE   RBC / HPF 0-5 0 - 5 RBC/hpf   WBC, UA 6-30 0 - 5 WBC/hpf   Bacteria, UA RARE (A) NONE SEEN   Squamous Epithelial / LPF NONE SEEN NONE SEEN   Mucous PRESENT   Lactic acid, plasma     Status: Abnormal   Collection Time: 04/27/16  7:43 PM  Result Value Ref Range   Lactic Acid, Venous 2.3 (HH) 0.5 - 1.9 mmol/L    Comment: CRITICAL RESULT CALLED TO, READ BACK BY AND VERIFIED WITH CHRISTINE MARTIN AT 2015 ON 04/27/16 RWW   Lactic acid, plasma     Status: None   Collection Time: 04/27/16 10:40 PM  Result Value Ref Range   Lactic Acid, Venous 1.4 0.5 - 1.9 mmol/L  Basic metabolic panel     Status: Abnormal   Collection Time: 04/28/16  5:02 AM  Result Value Ref Range   Sodium 142 135 - 145 mmol/L   Potassium 3.1 (L) 3.5 - 5.1 mmol/L   Chloride 108 101 - 111 mmol/L   CO2 28 22 - 32 mmol/L   Glucose, Bld 128 (H) 65 - 99 mg/dL   BUN 17 6 - 20 mg/dL   Creatinine, Ser 0.82 0.44 - 1.00 mg/dL   Calcium 8.5 (L) 8.9 - 10.3 mg/dL   GFR calc non Af Amer >60 >60 mL/min   GFR calc Af Amer >60 >60 mL/min    Comment: (NOTE) The eGFR has been calculated using the CKD EPI equation. This calculation has not been validated in all clinical situations. eGFR's persistently <60 mL/min signify possible Chronic Kidney Disease.    Anion gap 6 5 - 15  CBC     Status: None   Collection Time: 04/28/16  5:02 AM  Result Value Ref Range   WBC 10.8 3.6 - 11.0 K/uL  RBC 4.38 3.80 - 5.20 MIL/uL   Hemoglobin 12.7 12.0 - 16.0 g/dL   HCT 36.9 35.0 - 47.0 %   MCV 84.4 80.0 - 100.0 fL   MCH 29.1 26.0 - 34.0 pg   MCHC 34.5 32.0 - 36.0 g/dL   RDW 13.5 11.5 - 14.5 %   Platelets 199 150 - 440 K/uL   Ct Head Wo Contrast  Addendum Date: 04/28/2016   ADDENDUM REPORT: 04/28/2016 15:05 ADDENDUM: Phone contact completed at 1455 hours. Electronically Signed   By: Nelson Chimes M.D.   On: 04/28/2016 15:05   Result Date: 04/28/2016 CLINICAL DATA:  Dementia.  Unresponsive this morning. EXAM: CT HEAD  WITHOUT CONTRAST TECHNIQUE: Contiguous axial images were obtained from the base of the skull through the vertex without intravenous contrast. COMPARISON:  02/18/2015 FINDINGS: Brain: There is an acute intraparenchymal hematoma with the epicenter in the left parietal lobe measuring 7.8 x 3.6 x 6.6 cm (volume = 97 cm^3). There is a small amount of surrounding edema. There is intraventricular penetration with some layering blood evident in the occipital horn of the right lateral ventricle. There is mass effect with flattening of the posterior aspect of the left lateral ventricle and left-to-right shift of 3 mm. Elsewhere, the brain shows atrophy and chronic small vessel ischemic change. Vascular: There is atherosclerotic calcification of the major vessels at the base of the brain. Skull: No skull fracture. Sinuses/Orbits: Layering fluid in the right division of the sphenoid sinus. Other sinuses clear. Orbits negative. Other: None IMPRESSION: Acute intraparenchymal hemorrhage in the left parietal lobe with hematoma measuring 7.8 x 3.6 x 6.6 cm, 97 cc volume. Intraventricular penetration with a small amount of blood dependent in the lateral ventricle on the right. Mass effect with flattening of the posterior aspect of the left lateral ventricle and 3 mm of left-to-right shift. Critical Value/emergent results were called by telephone PA age at the time of interpretation on 04/28/2016 at 2:35 pm to Dr. Chana Bode PATEL. Call not yet returned. Electronically Signed: By: Nelson Chimes M.D. On: 04/28/2016 14:44   Ct Abdomen Pelvis W Contrast  Result Date: 04/27/2016 CLINICAL DATA:  Emesis and abdominal pain.  Altered mental status. EXAM: CT ABDOMEN AND PELVIS WITH CONTRAST TECHNIQUE: Multidetector CT imaging of the abdomen and pelvis was performed using the standard protocol following bolus administration of intravenous contrast. CONTRAST:  189m ISOVUE-300 IOPAMIDOL (ISOVUE-300) INJECTION 61% COMPARISON:  None. FINDINGS:  Lower chest: No acute abnormality allowing for motion. Tortuous atherosclerotic distal thoracic aorta. Heart is at the upper limits of normal in size. Hepatobiliary: There is a lamellated 2.9 cm gallstone within the gallbladder, no evidence of pericholecystic inflammation. No biliary dilatation. Scattered small subcentimeter hepatic hypodensities are too small to accurately characterize, likely cysts. A different subcapsular ill-defined hypodensity in segment 6 measuring 2.2 cm is of uncertain etiology. Pancreas: Atrophic parenchyma. No ductal dilatation or inflammation. Spleen: Normal in size without focal abnormality. Adrenals/Urinary Tract: There is an exophytic 2.4 cm nodule from the left adrenal gland. This is homogeneous in density with well-circumscribed borders. Right adrenal gland is normal. There is no hydronephrosis. Right kidney slightly ptotic in appearance with an extrarenal pelvis configuration. Scattered cortical hypodensities throughout both kidneys, majority too small to characterize, however likely simple cysts. There is a complex cysts in the right upper kidney without enhancement it measures 12 mm. No suspicious renal lesion. Ureters are decompressed. Urinary bladder is minimally distended, and displaced anteriorly by rectal stool. Stomach/Bowel: Stomach physiologically distended with fluid. Question  of wall thickening in the para pyloric stomach and proximal duodenum. Remaining small bowel is decompressed. No small bowel thickening or inflammation. There is moderate stool throughout the colon with colonic tortuosity. There is a small umbilical hernia that contains nonobstructed of the small bowel and anti mesenteric border of transverse colon, no associated wall thickening or inflammation. A stool ball distends the rectum spanning 7.9 cm. There is mild wall thickening of the distal most sigmoid colon and rectum. Chain sutures at the bases cecum, likely from appendectomy, the appendix is not  visualized. Vascular/Lymphatic: Atherosclerosis and tortuosity of the abdominal aorta and its branches. No aneurysm. No retroperitoneal, mesenteric or pelvic adenopathy. Reproductive: Status post hysterectomy. No adnexal masses. Other: No free air, free fluid, or intra-abdominal fluid collection. Musculoskeletal: Linear air within the right gluteal musculature. Felt to be within veins secondary to IV catheter placement. The bones are under mineralized. Chronic appearing compression deformities of L1, L2, and L4 superior endplates. Multilevel degenerative change throughout spine. Intramedullary rod in the right femur is partially included. IMPRESSION: 1. Moderate stool burden with rectal distention, concerning for fecal impaction. No bowel obstruction. 2. Small umbilical hernia contains noninflamed nonobstructed loop of small bowel and anti mesenteric border of transverse colon. 3. Gallstone without gallbladder inflammation. 4. **An incidental finding of potential clinical significance has been found. Left adrenal nodule measures 2.4 cm. There are no prior exams for comparison. In the absence of malignancy history, recommend adrenal protocol CT. ** 5. **An incidental finding of potential clinical significance has been found. Ill-defined 2.2 cm hypodense lesion in the right lobe of the liver. Recommend nonemergent hepatic protocol MRI, preferably when patient is able tolerate breath hold technique.** multiple additional subcentimeter hepatic lesions are too small to characterize. 6. Atherosclerosis including coronary artery calcifications. 7. Multiple remote lumbar spine compression fractures. Electronically Signed   By: Jeb Levering M.D.   On: 04/27/2016 21:33   Dg Chest Portable 1 View  Result Date: 04/27/2016 CLINICAL DATA:  Acute onset of elevated lactic acid. Vomiting. Initial encounter. EXAM: PORTABLE CHEST 1 VIEW COMPARISON:  Chest radiograph performed 02/17/2015 FINDINGS: The lungs are well-aerated  and clear. There is no evidence of focal opacification, pleural effusion or pneumothorax. The cardiomediastinal silhouette is within normal limits. No acute osseous abnormalities are seen. Loose bodies are noted 1 at the right joint space. IMPRESSION: No acute cardiopulmonary process seen. Electronically Signed   By: Garald Balding M.D.   On: 04/27/2016 23:20    Assessment: 82 y.o. female with hypertension and dementia presenting with a large left parietal parenchymal hemorrhage with ventricular extension and mild mass effect. Time of onset is unclear but suspected to have happened 36-48 hours ago. Patient has remained awake and alert. There is no indication at this time for measures to acutely manage cerebral edema.  Stroke Risk Factors - family history and hypertension  Plan: 1. HgbA1c, fasting lipid panel 2. Repeat CT of the head without contrast on 04/29/2016 3. PT consult, OT consult, Speech consult 4. Echocardiogram 5. Carotid dopplers 6. Prophylactic therapy-None 7. Risk factor modification 8. Rocephin for probable UTI 1g IV every 24 hours  C.R. Nicole Kindred, MD [facility] [phone]  04/28/2016, 9:01 PM

## 2016-04-28 NOTE — H&P (Signed)
[facility] NAME: [name]    MR#:  [id number]  DATE OF BIRTH:  Apr 01, 1928  DATE OF ADMISSION:  04/27/2016  PRIMARY CARE PHYSICIAN: No PCP Per Patient   REQUESTING/REFERRING PHYSICIAN: Karma Greaser, MD  CHIEF COMPLAINT:   Chief Complaint  Patient presents with  . Emesis    HISTORY OF PRESENT ILLNESS:  [name]  is a 82 y.o. female who presents with Abdominal pain and UTI. Patient had a UA performed earlier at her facility and was found to be nitrite positive. She was given IM antibiotics for the same. However she continued to have abdominal pain based on her symptoms and had a few episodes of vomiting. She was sent to the ED for further evaluation. Here her UA was largely normal, but this is in the setting of prior dose of antibiotics, and likely represents a false negative. History of present illness was taken from collateral information from the facility where the patient resides that she herself has Alzheimer's dementia and is largely nonverbal.  PAST MEDICAL HISTORY:   Past Medical History:  Diagnosis Date  . Alzheimer's dementia   . Anemia   . CKD (chronic kidney disease)    stage 3  . Colon cancer ([place])   . Constipation   . CVA (cerebral vascular accident) ([place])   . Dementia   . Hypertension   . PVD (peripheral vascular disease) ([place])   . UTI (urinary tract infection)     PAST SURGICAL HISTORY:   Past Surgical History:  Procedure Laterality Date  . NO PAST SURGERIES      SOCIAL HISTORY:   Social History  Substance Use Topics  . Smoking status: Never Smoker  . Smokeless tobacco: Never Used  . Alcohol use No    FAMILY HISTORY:   Family History  Problem Relation Age of Onset  . Family history unknown: Yes    DRUG ALLERGIES:  No Known Allergies  MEDICATIONS AT HOME:   Prior to Admission medications   Medication Sig Start Date End Date Taking? Authorizing Provider  aspirin EC 81 MG  tablet Take 81 mg by mouth daily.   Yes Historical Provider, MD  atenolol (TENORMIN) 50 MG tablet Take 50 mg by mouth daily.   Yes Historical Provider, MD  azelastine (ASTELIN) 0.1 % nasal spray Place 1 spray into both nostrils 2 (two) times daily. Use in each nostril as directed   Yes Historical Provider, MD  Calcium Carbonate-Vitamin D3 (CALCIUM 600+D3) 600-400 MG-UNIT TABS Take 1 tablet by mouth 2 (two) times daily.   Yes Historical Provider, MD  clopidogrel (PLAVIX) 75 MG tablet Take 75 mg by mouth daily.   Yes Historical Provider, MD  felodipine (PLENDIL) 10 MG 24 hr tablet Take 10 mg by mouth daily.   Yes Historical Provider, MD  guaiFENesin-dextromethorphan (ROBITUSSIN DM) 100-10 MG/5ML syrup Take 10 mLs by mouth every 4 (four) hours as needed for cough.   Yes Historical Provider, MD  hydrochlorothiazide (MICROZIDE) 12.5 MG capsule Take 12.5 mg by mouth daily.   Yes Historical Provider, MD  loratadine (CLARITIN) 10 MG tablet Take 10 mg by mouth daily.   Yes Historical Provider, MD  magnesium hydroxide (MILK OF MAGNESIA) 400 MG/5ML suspension Take 10 mLs by mouth daily as needed for mild constipation.   Yes Historical Provider, MD  memantine (NAMENDA) 5 MG tablet Take 5 mg by mouth daily.   Yes Historical Provider, MD  potassium chloride SA (K-DUR,KLOR-CON) 20  MEQ tablet Take 20 mEq by mouth daily.   Yes Historical Provider, MD  ranitidine (ZANTAC) 150 MG tablet Take 150 mg by mouth daily.   Yes Historical Provider, MD  senna-docusate (SENOKOT-S) 8.6-50 MG tablet Take 1 tablet by mouth daily.   Yes Historical Provider, MD  traZODone (DESYREL) 100 MG tablet Take 100 mg by mouth at bedtime. Take 2 tablets PO at bedtime.   Yes Historical Provider, MD    REVIEW OF SYSTEMS:  Review of Systems  Unable to perform ROS: Dementia     VITAL SIGNS:   Vitals:   04/27/16 2200 04/27/16 2300 04/27/16 2300 04/27/16 2330  BP: (!) 155/93 (!) 164/90  (!) 170/78  Pulse: 99 99  98  Resp: 16 18  13    Temp:   99.8 F (37.7 C)   TempSrc:   Oral   SpO2: 95% 99%  96%  Weight:      Height:       Wt Readings from Last 3 Encounters:  04/27/16 74.8 kg (165 lb)  02/17/15 68 kg (150 lb)    PHYSICAL EXAMINATION:  Physical Exam  Vitals reviewed. Constitutional: She appears well-developed and well-nourished. No distress.  HENT:  Head: Normocephalic and atraumatic.  Mouth/Throat: Oropharynx is clear and moist.  Eyes: Conjunctivae and EOM are normal. Pupils are equal, round, and reactive to light. No scleral icterus.  Neck: Normal range of motion. Neck supple. No JVD present. No thyromegaly present.  Cardiovascular: Normal rate, regular rhythm and intact distal pulses.  Exam reveals no gallop and no friction rub.   No murmur heard. Respiratory: Effort normal and breath sounds normal. No respiratory distress. She has no wheezes. She has no rales.  GI: Soft. Bowel sounds are normal. She exhibits no distension. There is tenderness (suprapubic).  Musculoskeletal: Normal range of motion. She exhibits no edema.  No arthritis, no gout  Lymphadenopathy:    She has no cervical adenopathy.  Neurological: No cranial nerve deficit.  Unable to fully assess due to patient condition  Skin: Skin is warm and dry. No rash noted. No erythema.  Psychiatric: Thought content normal.  Unable to assess due to patient condition    LABORATORY PANEL:   CBC  Recent Labs Lab 04/27/16 1810  WBC 10.6  HGB 14.4  HCT 42.1  PLT 216   ------------------------------------------------------------------------------------------------------------------  Chemistries   Recent Labs Lab 04/27/16 1810  NA 138  K 3.0*  CL 100*  CO2 26  GLUCOSE 143*  BUN 19  CREATININE 0.77  CALCIUM 9.3  AST 38  ALT 16  ALKPHOS 74  BILITOT 0.5   ------------------------------------------------------------------------------------------------------------------  Cardiac Enzymes No results for input(s): TROPONINI in the  last 168 hours. ------------------------------------------------------------------------------------------------------------------  RADIOLOGY:  Ct Abdomen Pelvis W Contrast  Result Date: 04/27/2016 CLINICAL DATA:  Emesis and abdominal pain.  Altered mental status. EXAM: CT ABDOMEN AND PELVIS WITH CONTRAST TECHNIQUE: Multidetector CT imaging of the abdomen and pelvis was performed using the standard protocol following bolus administration of intravenous contrast. CONTRAST:  162mL ISOVUE-300 IOPAMIDOL (ISOVUE-300) INJECTION 61% COMPARISON:  None. FINDINGS: Lower chest: No acute abnormality allowing for motion. Tortuous atherosclerotic distal thoracic aorta. Heart is at the upper limits of normal in size. Hepatobiliary: There is a lamellated 2.9 cm gallstone within the gallbladder, no evidence of pericholecystic inflammation. No biliary dilatation. Scattered small subcentimeter hepatic hypodensities are too small to accurately characterize, likely cysts. A different subcapsular ill-defined hypodensity in segment 6 measuring 2.2 cm is of uncertain etiology. Pancreas: Atrophic parenchyma.  No ductal dilatation or inflammation. Spleen: Normal in size without focal abnormality. Adrenals/Urinary Tract: There is an exophytic 2.4 cm nodule from the left adrenal gland. This is homogeneous in density with well-circumscribed borders. Right adrenal gland is normal. There is no hydronephrosis. Right kidney slightly ptotic in appearance with an extrarenal pelvis configuration. Scattered cortical hypodensities throughout both kidneys, majority too small to characterize, however likely simple cysts. There is a complex cysts in the right upper kidney without enhancement it measures 12 mm. No suspicious renal lesion. Ureters are decompressed. Urinary bladder is minimally distended, and displaced anteriorly by rectal stool. Stomach/Bowel: Stomach physiologically distended with fluid. Question of wall thickening in the para pyloric  stomach and proximal duodenum. Remaining small bowel is decompressed. No small bowel thickening or inflammation. There is moderate stool throughout the colon with colonic tortuosity. There is a small umbilical hernia that contains nonobstructed of the small bowel and anti mesenteric border of transverse colon, no associated wall thickening or inflammation. A stool ball distends the rectum spanning 7.9 cm. There is mild wall thickening of the distal most sigmoid colon and rectum. Chain sutures at the bases cecum, likely from appendectomy, the appendix is not visualized. Vascular/Lymphatic: Atherosclerosis and tortuosity of the abdominal aorta and its branches. No aneurysm. No retroperitoneal, mesenteric or pelvic adenopathy. Reproductive: Status post hysterectomy. No adnexal masses. Other: No free air, free fluid, or intra-abdominal fluid collection. Musculoskeletal: Linear air within the right gluteal musculature. Felt to be within veins secondary to IV catheter placement. The bones are under mineralized. Chronic appearing compression deformities of L1, L2, and L4 superior endplates. Multilevel degenerative change throughout spine. Intramedullary rod in the right femur is partially included. IMPRESSION: 1. Moderate stool burden with rectal distention, concerning for fecal impaction. No bowel obstruction. 2. Small umbilical hernia contains noninflamed nonobstructed loop of small bowel and anti mesenteric border of transverse colon. 3. Gallstone without gallbladder inflammation. 4. **An incidental finding of potential clinical significance has been found. Left adrenal nodule measures 2.4 cm. There are no prior exams for comparison. In the absence of malignancy history, recommend adrenal protocol CT. ** 5. **An incidental finding of potential clinical significance has been found. Ill-defined 2.2 cm hypodense lesion in the right lobe of the liver. Recommend nonemergent hepatic protocol MRI, preferably when patient is  able tolerate breath hold technique.** multiple additional subcentimeter hepatic lesions are too small to characterize. 6. Atherosclerosis including coronary artery calcifications. 7. Multiple remote lumbar spine compression fractures. Electronically Signed   By: Jeb Levering M.D.   On: 04/27/2016 21:33   Dg Chest Portable 1 View  Result Date: 04/27/2016 CLINICAL DATA:  Acute onset of elevated lactic acid. Vomiting. Initial encounter. EXAM: PORTABLE CHEST 1 VIEW COMPARISON:  Chest radiograph performed 02/17/2015 FINDINGS: The lungs are well-aerated and clear. There is no evidence of focal opacification, pleural effusion or pneumothorax. The cardiomediastinal silhouette is within normal limits. No acute osseous abnormalities are seen. Loose bodies are noted 1 at the right joint space. IMPRESSION: No acute cardiopulmonary process seen. Electronically Signed   By: Garald Balding M.D.   On: 04/27/2016 23:20    EKG:   Orders placed or performed during the hospital encounter of 04/27/16  . ED EKG  . ED EKG  . EKG 12-Lead  . EKG 12-Lead    IMPRESSION AND PLAN:  Principal Problem:   UTI (urinary tract infection) - we will continue the IV antibiotics that were started in the ED, urine culture was sent. Active Problems:  HTN (hypertension) - continue home meds   PVD (peripheral vascular disease) ([place]) - continue home meds including dual antiplatelet therapy   Alzheimer's dementia - continue home meds  All the records are reviewed and case discussed with ED provider. Management plans discussed with the patient and/or family.  DVT PROPHYLAXIS: SubQ lovenox  GI PROPHYLAXIS: None  ADMISSION STATUS: Inpatient  CODE STATUS: Partial - No intubation/mechanical ventilation Code Status History    This patient does not have a recorded code status. Please follow your organizational policy for patients in this situation.      TOTAL TIME TAKING CARE OF THIS PATIENT: 45 minutes.    Psalm Schappell,  Tranise Forrest [place] 04/28/2016, 12:32 AM  [facility]  Office  [phone]  CC: Primary care physician; No PCP Per Patient

## 2016-04-28 NOTE — Progress Notes (Signed)
Pt transferred to [facility], [place]. Report called to [place], Rn. Transported by [employer] , report given to [place], EMT. Pt's family accompanied, pt's belongings given to family.

## 2016-04-28 NOTE — Progress Notes (Addendum)
[name] at [facility]                                                                                                                                                                                  Patient Demographics   [name], is a 82 y.o. female, DOB - 11-27-1927, [id number]  Admit date - 04/27/2016   Admitting Physician Lance Coon, MD  Outpatient Primary MD for the patient is No PCP Per Patient   LOS - 0  Subjective: Pt  Admitted with abdominal pain and altered mental status pt waking up more    Review of Systems:   CONSTITUTIONAL: confused unable to provide  Vitals:   Vitals:   04/28/16 0130 04/28/16 0253 04/28/16 0750 04/28/16 1311  BP: (!) 161/86 (!) 158/66 (!) 171/71 (!) 166/75  Pulse: 91 80 85 92  Resp: 18 18    Temp:   99 F (37.2 C) 99.3 F (37.4 C)  TempSrc:   Oral Axillary  SpO2: 95% 98% 99% 100%  Weight:  124 lb 9.6 oz (56.5 kg)    Height:        Wt Readings from Last 3 Encounters:  04/28/16 124 lb 9.6 oz (56.5 kg)  02/17/15 150 lb (68 kg)     Intake/Output Summary (Last 24 hours) at 04/28/16 1315 Last data filed at 04/28/16 0400  Gross per 24 hour  Intake           113.75 ml  Output                0 ml  Net           113.75 ml    Physical Exam:   GENERAL: Nonverbal HEAD, EYES, EARS, NOSE AND THROAT: Atraumatic, normocephalic. Extraocular muscles are intact. Pupils equal and reactive to light. Sclerae anicteric. No conjunctival injection. No oro-pharyngeal erythema.  NECK: Supple. There is no jugular venous distention. No bruits, no lymphadenopathy, no thyromegaly.  HEART: Regular rate and rhythm,. No murmurs, no rubs, no clicks.  LUNGS: Clear to auscultation bilaterally. No rales or rhonchi. No wheezes.  ABDOMEN: Soft, flat, nontender, nondistended. Has good bowel sounds. No hepatosplenomegaly appreciated.  EXTREMITIES: No evidence of any cyanosis, clubbing, or peripheral edema.  +2 pedal and radial  pulses bilaterally.  NEUROLOGIC: pt unable to move right upper ext and right lower ext   SKIN: Moist and warm with no rashes appreciated.  Psych: Not anxious, depressed LN: No inguinal LN enlargement    Antibiotics   Anti-infectives    Start     Dose/Rate Route Frequency Ordered Stop   04/29/16 1000  cefTRIAXone (ROCEPHIN) IVPB 2 g  2 g 100 mL/hr over 30 Minutes Intravenous Every 24 hours 04/28/16 0233     04/28/16 0400  cefTRIAXone (ROCEPHIN) IVPB 2 g     2 g 100 mL/hr over 30 Minutes Intravenous  Once 04/28/16 0233 04/28/16 0551      Medications   Scheduled Meds: . aspirin EC  81 mg Oral Daily  . atenolol  50 mg Oral Daily  . [START ON 04/29/2016] cefTRIAXone  2 g Intravenous Q24H  . clopidogrel  75 mg Oral Daily  . enoxaparin (LOVENOX) injection  40 mg Subcutaneous Q24H  . famotidine  20 mg Oral Daily  . felodipine  10 mg Oral Daily  . memantine  5 mg Oral Daily   Continuous Infusions: . 0.9 % NaCl with KCl 40 mEq / L     PRN Meds:.acetaminophen **OR** acetaminophen, hydrALAZINE, ondansetron **OR** ondansetron (ZOFRAN) IV   Data Review:   Micro Results No results found for this or any previous visit (from the past 240 hour(s)).  Radiology Reports Ct Abdomen Pelvis W Contrast  Result Date: 04/27/2016 CLINICAL DATA:  Emesis and abdominal pain.  Altered mental status. EXAM: CT ABDOMEN AND PELVIS WITH CONTRAST TECHNIQUE: Multidetector CT imaging of the abdomen and pelvis was performed using the standard protocol following bolus administration of intravenous contrast. CONTRAST:  155mL ISOVUE-300 IOPAMIDOL (ISOVUE-300) INJECTION 61% COMPARISON:  None. FINDINGS: Lower chest: No acute abnormality allowing for motion. Tortuous atherosclerotic distal thoracic aorta. Heart is at the upper limits of normal in size. Hepatobiliary: There is a lamellated 2.9 cm gallstone within the gallbladder, no evidence of pericholecystic inflammation. No biliary dilatation. Scattered small  subcentimeter hepatic hypodensities are too small to accurately characterize, likely cysts. A different subcapsular ill-defined hypodensity in segment 6 measuring 2.2 cm is of uncertain etiology. Pancreas: Atrophic parenchyma. No ductal dilatation or inflammation. Spleen: Normal in size without focal abnormality. Adrenals/Urinary Tract: There is an exophytic 2.4 cm nodule from the left adrenal gland. This is homogeneous in density with well-circumscribed borders. Right adrenal gland is normal. There is no hydronephrosis. Right kidney slightly ptotic in appearance with an extrarenal pelvis configuration. Scattered cortical hypodensities throughout both kidneys, majority too small to characterize, however likely simple cysts. There is a complex cysts in the right upper kidney without enhancement it measures 12 mm. No suspicious renal lesion. Ureters are decompressed. Urinary bladder is minimally distended, and displaced anteriorly by rectal stool. Stomach/Bowel: Stomach physiologically distended with fluid. Question of wall thickening in the para pyloric stomach and proximal duodenum. Remaining small bowel is decompressed. No small bowel thickening or inflammation. There is moderate stool throughout the colon with colonic tortuosity. There is a small umbilical hernia that contains nonobstructed of the small bowel and anti mesenteric border of transverse colon, no associated wall thickening or inflammation. A stool ball distends the rectum spanning 7.9 cm. There is mild wall thickening of the distal most sigmoid colon and rectum. Chain sutures at the bases cecum, likely from appendectomy, the appendix is not visualized. Vascular/Lymphatic: Atherosclerosis and tortuosity of the abdominal aorta and its branches. No aneurysm. No retroperitoneal, mesenteric or pelvic adenopathy. Reproductive: Status post hysterectomy. No adnexal masses. Other: No free air, free fluid, or intra-abdominal fluid collection. Musculoskeletal:  Linear air within the right gluteal musculature. Felt to be within veins secondary to IV catheter placement. The bones are under mineralized. Chronic appearing compression deformities of L1, L2, and L4 superior endplates. Multilevel degenerative change throughout spine. Intramedullary rod in the right femur is partially included. IMPRESSION: 1.  Moderate stool burden with rectal distention, concerning for fecal impaction. No bowel obstruction. 2. Small umbilical hernia contains noninflamed nonobstructed loop of small bowel and anti mesenteric border of transverse colon. 3. Gallstone without gallbladder inflammation. 4. **An incidental finding of potential clinical significance has been found. Left adrenal nodule measures 2.4 cm. There are no prior exams for comparison. In the absence of malignancy history, recommend adrenal protocol CT. ** 5. **An incidental finding of potential clinical significance has been found. Ill-defined 2.2 cm hypodense lesion in the right lobe of the liver. Recommend nonemergent hepatic protocol MRI, preferably when patient is able tolerate breath hold technique.** multiple additional subcentimeter hepatic lesions are too small to characterize. 6. Atherosclerosis including coronary artery calcifications. 7. Multiple remote lumbar spine compression fractures. Electronically Signed   By: Jeb Levering M.D.   On: 04/27/2016 21:33   Dg Chest Portable 1 View  Result Date: 04/27/2016 CLINICAL DATA:  Acute onset of elevated lactic acid. Vomiting. Initial encounter. EXAM: PORTABLE CHEST 1 VIEW COMPARISON:  Chest radiograph performed 02/17/2015 FINDINGS: The lungs are well-aerated and clear. There is no evidence of focal opacification, pleural effusion or pneumothorax. The cardiomediastinal silhouette is within normal limits. No acute osseous abnormalities are seen. Loose bodies are noted 1 at the right joint space. IMPRESSION: No acute cardiopulmonary process seen. Electronically Signed    By: Garald Balding M.D.   On: 04/27/2016 23:20     CBC  Recent Labs Lab 04/27/16 1810 04/28/16 0502  WBC 10.6 10.8  HGB 14.4 12.7  HCT 42.1 36.9  PLT 216 199  MCV 85.1 84.4  MCH 29.1 29.1  MCHC 34.2 34.5  RDW 13.4 13.5    Chemistries   Recent Labs Lab 04/27/16 1810 04/28/16 0502  NA 138 142  K 3.0* 3.1*  CL 100* 108  CO2 26 28  GLUCOSE 143* 128*  BUN 19 17  CREATININE 0.77 0.82  CALCIUM 9.3 8.5*  AST 38  --   ALT 16  --   ALKPHOS 74  --   BILITOT 0.5  --    ------------------------------------------------------------------------------------------------------------------ estimated creatinine clearance is 43.9 mL/min (by C-G formula based on SCr of 0.82 mg/dL). ------------------------------------------------------------------------------------------------------------------ No results for input(s): HGBA1C in the last 72 hours. ------------------------------------------------------------------------------------------------------------------ No results for input(s): CHOL, HDL, LDLCALC, TRIG, CHOLHDL, LDLDIRECT in the last 72 hours. ------------------------------------------------------------------------------------------------------------------ No results for input(s): TSH, T4TOTAL, T3FREE, THYROIDAB in the last 72 hours.  Invalid input(s): FREET3 ------------------------------------------------------------------------------------------------------------------ No results for input(s): VITAMINB12, FOLATE, FERRITIN, TIBC, IRON, RETICCTPCT in the last 72 hours.  Coagulation profile No results for input(s): INR, PROTIME in the last 168 hours.  No results for input(s): DDIMER in the last 72 hours.  Cardiac Enzymes No results for input(s): CKMB, TROPONINI, MYOGLOBIN in the last 168 hours.  Invalid input(s): CK ------------------------------------------------------------------------------------------------------------------ Invalid input(s): [place]  Pt is 82 y.o admitted with altered mental status. 1. Acute encephalopathy due to acute intracranial hemorrhage: Blood pressure control for systolic blood pressure less than 130, she'll be started on hypertonic normal saline solution,  2.  HTN (hypertension) - she will have clonidine added to her regimen as well as IV labetalol as well as IV hydralazine 3.  PVD (peripheral vascular disease) ([place]) -discontinue antiplatelet therapy 4.  Alzheimer's dementia - continue home meds     Code Status Orders        Start     Ordered   04/28/16 0209  Limited resuscitation (code)  Continuous  Question Answer Comment  In the event of cardiac or respiratory ARREST: Initiate Code Blue, Call Rapid Response Yes   In the event of cardiac or respiratory ARREST: Perform CPR Yes   In the event of cardiac or respiratory ARREST: Perform Intubation/Mechanical Ventilation No   In the event of cardiac or respiratory ARREST: Use NIPPV/BiPAp only if indicated Yes   In the event of cardiac or respiratory ARREST: Administer ACLS medications if indicated Yes   In the event of cardiac or respiratory ARREST: Perform Defibrillation or Cardioversion if indicated Yes      04/28/16 0208    Code Status History    Date Active Date Inactive Code Status Order ID Comments User Context   This patient has a current code status but no historical code status.              DVT Prophylaxis  Lovenox   Lab Results  Component Value Date   PLT 199 04/28/2016     Time Spent in minutes Greater than 120 minutes spent on updating family contacting multiple physicians for arranging transfer patient is critically ill  Dustin Flock M.D on 04/28/2016 at 1:15 PM  Between 7am to 6pm - Pager - [phone]  After 6pm go to [email or web address] - password [email or web address] [place] [facility]   Office  [phone]

## 2016-04-29 ENCOUNTER — Inpatient Hospital Stay (HOSPITAL_COMMUNITY): Payer: Medicare (Managed Care)

## 2016-04-29 DIAGNOSIS — I10 Essential (primary) hypertension: Secondary | ICD-10-CM | POA: Diagnosis present

## 2016-04-29 DIAGNOSIS — E876 Hypokalemia: Secondary | ICD-10-CM | POA: Diagnosis present

## 2016-04-29 LAB — URINE CULTURE
CULTURE: NO GROWTH
SPECIAL REQUESTS: NORMAL

## 2016-04-29 MED ORDER — SODIUM CHLORIDE 0.9 % IV SOLN
30.0000 meq | Freq: Once | INTRAVENOUS | Status: AC
  Administered 2016-04-29: 30 meq via INTRAVENOUS
  Filled 2016-04-29: qty 15

## 2016-04-29 MED ORDER — ATENOLOL 50 MG PO TABS
50.0000 mg | ORAL_TABLET | Freq: Every day | ORAL | Status: DC
  Administered 2016-04-30: 50 mg via NASOGASTRIC
  Filled 2016-04-29: qty 1

## 2016-04-29 MED ORDER — POLYETHYLENE GLYCOL 3350 17 G PO PACK: 17.0000 g | PACK | Freq: Every day | ORAL | Status: DC | PRN

## 2016-04-29 MED ORDER — CLONIDINE HCL 0.1 MG PO TABS
0.1000 mg | ORAL_TABLET | Freq: Two times a day (BID) | ORAL | Status: DC
  Administered 2016-04-30 (×2): 0.1 mg via ORAL
  Filled 2016-04-29 (×2): qty 1

## 2016-04-29 MED ORDER — DOCUSATE SODIUM 50 MG/5ML PO LIQD
100.0000 mg | Freq: Two times a day (BID) | ORAL | Status: DC
  Administered 2016-04-30 – 2016-05-07 (×13): 100 mg via ORAL
  Filled 2016-04-29 (×16): qty 10

## 2016-04-29 MED ORDER — FELODIPINE ER 10 MG PO TB24
10.0000 mg | ORAL_TABLET | Freq: Every day | ORAL | Status: DC
  Administered 2016-05-04 – 2016-05-07 (×4): 10 mg via ORAL
  Filled 2016-04-29 (×9): qty 1

## 2016-04-29 NOTE — Progress Notes (Signed)
STROKE TEAM PROGRESS NOTE   HISTORY OF PRESENT ILLNESS (per record) [name] is an 82 y.o. female with a history of hypertension, dementia, chronic kidney disease, previous stroke , peripheral vascular disease and urinary tract  Infections, transferred from [facility] after CT scan of the head was obtained which showed a large left intracerebral hemorrhage with ventricular extension and mild mass effect. Onset is unclear. She was seen initially at [facility] on the morning of 04/27/2016 for altered mental status and thought to have recurrent urinary tract infection. She was given IM antibiotics.She was brought back to [facility] the same afternoon after vomiting blood and was admitted. Patient has been taking aspirin 81 mg per day. CT scan of her head was obtained because of persistent mental status changes. Study showed a large left parietal intraparenchymal hemorrhage with ventricular extension. Volume was 97 cm. Patient had premorbid speech abnormality with minimal speech output. Right hemiplegia is new. NIH stroke score was 21.  LSN: 04/26/2016 tPA Given: No: acute ICH mRankin:    SUBJECTIVE (INTERVAL HISTORY) Her family is not at the bedside.  Overall she she is unable to discuss her condition.  RN at the bedside and reports that the patient's exam is unchanged. RN also reported that family may have indicated to another nursing colleague that patient is mute at baseline.   OBJECTIVE Temp:  [99.3 F (37.4 C)-102 F (38.9 C)] 99.9 F (37.7 C) (12/31 0400) Pulse Rate:  [88-104] 102 (12/31 0700) Cardiac Rhythm: Normal sinus rhythm (12/30 2300) Resp:  [10-26] 21 (12/31 0700) BP: (122-173)/(52-90) 158/80 (12/31 0700) SpO2:  [95 %-100 %] 98 % (12/31 0700) Weight:  [56.7 kg (125 lb)] 56.7 kg (125 lb) (12/30 2015)  CBC:  Recent Labs Lab 04/27/16 1810 04/28/16 0502  WBC 10.6 10.8  HGB 14.4 12.7  HCT 42.1 36.9  MCV 85.1 84.4  PLT 216 [id number]    Basic Metabolic Panel:  Recent Labs Lab  04/27/16 1810 04/28/16 0502  NA 138 142  K 3.0* 3.1*  CL 100* 108  CO2 26 28  GLUCOSE 143* 128*  BUN 19 17  CREATININE 0.77 0.82  CALCIUM 9.3 8.5*    Lipid Panel: No results found for: CHOL, TRIG, HDL, CHOLHDL, VLDL, LDLCALC HgbA1c: No results found for: HGBA1C Urine Drug Screen: No results found for: LABOPIA, COCAINSCRNUR, LABBENZ, AMPHETMU, THCU, LABBARB    IMAGING  Ct Head Wo Contrast 04/28/2016   Acute intraparenchymal hemorrhage in the left parietal lobe with hematoma measuring 7.8 x 3.6 x 6.6 cm, 97 cc volume. Intraventricular penetration with a small amount of blood dependent in the lateral ventricle on the right. Mass effect with flattening of the posterior aspect of the left lateral ventricle and 3 mm of left-to-right shift.    Ct Head Wo Contrast 04/29/2016 Pending   Ct Abdomen Pelvis W Contrast 04/27/2016 1. Moderate stool burden with rectal distention, concerning for fecal impaction. No bowel obstruction.  2. Small umbilical hernia contains noninflamed nonobstructed loop of small bowel and anti mesenteric border of transverse colon.  3. Gallstone without gallbladder inflammation.  4. **An incidental finding of potential clinical significance has been found. Left adrenal nodule measures 2.4 cm. There are no prior exams for comparison. In the absence of malignancy history, recommend adrenal protocol CT. **  5. **An incidental finding of potential clinical significance has been found. Ill-defined 2.2 cm hypodense lesion in the right lobe of the liver. Recommend nonemergent hepatic protocol MRI, preferably when patient is able tolerate breath hold technique.**  multiple additional subcentimeter hepatic lesions are too small to characterize.  6. Atherosclerosis including coronary artery calcifications.  7. Multiple remote lumbar spine compression fractures.   Dg Chest Portable 1 View 04/27/2016 No acute cardiopulmonary process seen.    PHYSICAL EXAM HEENT-   Normocephalic.  Normal external eye and conjunctiva.   Normal external nose, mucus membranes and septum.  Normal pharynx. Neck supple with no masses, nodes, nodules or enlargement. Cardiovascular - regular rate and rhythm, S1, S2 normal, systolic ejection murmur Lungs - chest clear, no wheezing, rales, normal symmetric air entry Abdomen - soft, non-tender; bowel sounds normal; no masses,  no organomegaly Extremities - no joint deformities, effusion, or inflammation  Neurologic Examination: Mental Status: Alert, no speech output and unable to follow verbal commands. She had visual tracking on the left side but completely neglected her right side. She was in no acute distress. Cranial Nerves: II-dense right homonymous hemianopsia. III/IV/VI-Pupils were equal and reacted. Extraocular movements were limited to midline and  conjugately to the left side.    VII-mild right lower facial weakness. VIII-normal. X-no speech output. Motor: normal strength and tone of left extremities.  Right side is fairly brisk in response to noxious stimuli in the UE and less in the LE Sensory: as above Gait: Not tested   ASSESSMENT/PLAN [name] is a 82 y.o. female with history of hypertension, dementia, previous CVA, baseline speech abnormality, urinary tract infections, peripheral vascular disease, colon cancer, chronic kidney disease, and anemia presenting with altered mental status and right hemiplegia.  She did not receive IV t-PA due to [place].  Acute intraparenchymal hemorrhage:  Dominant - secondary to an unknown etiology.  Resultant  Neglect and right sided weakness  MRI - not indicated  MRA - not indicated  CT - Acute intraparenchymal hemorrhage in the left parietal lobe.  Head CT repeat - pending  Carotid Doppler - not indicated  2D Echo - pending  LDL - not performed  HgbA1c - not performed  VTE prophylaxis - SCDs  Diet NPO time specified  aspirin 81 mg daily and clopidogrel  75 mg daily prior to admission, now on No antithrombotic  Ongoing aggressive stroke risk factor management  Therapy recommendations: pending  Disposition: Pending  Hypertension  Stable - Cleviprex  Goal SBP set at <160  Long-term BP goal normotensive  Hyperlipidemia  Home meds: No lipid lowering medications prior to admission.  Other Stroke Risk Factors  Advanced age  Hx stroke/TIA  Other Active Problems  Left Adrenal nodule - will require further workup.  Right Lobe Liver nodule - will require further workup.  Hypokalemia - 3.1 -> supplement and repeat labs in a.m.  Hospital day # 1  CRITICAL CARE NEUROLOGY ATTENDING NOTE This patient is critically ill and at significant risk of neurological worsening, death and care requires constant monitoring of vital signs, hemodynamics,respiratory and cardiac monitoring, extensive review of multiple databases, frequent neurological assessment, discussion with family, other specialists and medical decision making of high complexity. This critical care time does not reflect procedure time, or teaching time or supervisory time of PA/NP/Med Resident etc. but could involve care discussion time. Patient was seen and examined by me personally.   I reviewed notes, independently viewed imaging studies. The laboratory and radiographic studies were personally reviewed by me.  ROS: pertinent positives could not be fully documented due to aphasia  Assessment and plan completed by me personally:   CT of head pending  Reviewed Home BP medications:  Goal is  to wean off Cleviprex  Atenolol 50mg  daily started today 12/31  Decreased home Clonidine and started 0.1mg  BID on 12/31 (starting lower given the possible sedating side effects)  Plendil 10mg  daily started today 12/31  HCTZ 25mg  daily held due to hypokalemia at present time  ECHO pending  Discussed oral meds with RN.  Awaiting ST evaluation.  If patient fails, an NGT will be placed  for medication  Patient appears to have some fecal retention.  Added Colace BID and Miralax prn today  Hypokalemia replaced and will follow  Condition: is unchanged   I spent 40 minutes of Neurocritical Care time in the care of  this patient.  SIGNED BY: Dr. Elissa Hefty    To contact Stroke Continuity provider, please refer to [email or web address] After hours, contact General Neurology

## 2016-04-29 NOTE — Progress Notes (Addendum)
PT Cancellation Note  Patient Details Name: [name] MRN: [id number] DOB: 1927/11/22   Cancelled Treatment:    Reason Eval/Treat Not Completed: Patient not medically ready  Pt on bedrest. Will await increase in activity orders prior to PT evaluation.  Per RN, follow up tomorrow.  Marguarite Arbour A Simon Aaberg 04/29/2016, 7:21 AM Wray Kearns, PT, DPT [phone]

## 2016-04-30 ENCOUNTER — Inpatient Hospital Stay (HOSPITAL_COMMUNITY): Payer: Medicare (Managed Care)

## 2016-04-30 DIAGNOSIS — R509 Fever, unspecified: Secondary | ICD-10-CM

## 2016-04-30 LAB — BASIC METABOLIC PANEL
ANION GAP: 9 (ref 5–15)
BUN: 12 mg/dL (ref 6–20)
CO2: 26 mmol/L (ref 22–32)
Calcium: 8.4 mg/dL — ABNORMAL LOW (ref 8.9–10.3)
Chloride: 108 mmol/L (ref 101–111)
Creatinine, Ser: 0.66 mg/dL (ref 0.44–1.00)
GFR calc Af Amer: >60 mL/min (ref >60)
Glucose, Bld: 124 mg/dL — ABNORMAL HIGH (ref 65–99)
POTASSIUM: 2.5 mmol/L — AB (ref 3.5–5.1)
SODIUM: 143 mmol/L (ref 135–145)

## 2016-04-30 LAB — CBC
HCT: 36.6 % (ref 36.0–46.0)
Hemoglobin: 12.3 g/dL (ref 12.0–15.0)
MCH: 29 pg (ref 26.0–34.0)
MCHC: 33.6 g/dL (ref 30.0–36.0)
MCV: 86.3 fL (ref 78.0–100.0)
PLATELETS: 164 10*3/uL (ref 150–400)
RBC: 4.24 MIL/uL (ref 3.87–5.11)
RDW: 13.1 % (ref 11.5–15.5)
WBC: 16.9 10*3/uL — AB (ref 4.0–10.5)

## 2016-04-30 MED ORDER — SODIUM CHLORIDE 0.9 % IV SOLN
Freq: Once | INTRAVENOUS | Status: AC
  Administered 2016-04-30: 09:00:00 via INTRAVENOUS
  Filled 2016-04-30: qty 1000

## 2016-04-30 MED ORDER — PIPERACILLIN-TAZOBACTAM 3.375 G IVPB
3.3750 g | Freq: Three times a day (TID) | INTRAVENOUS | Status: DC
  Administered 2016-04-30 – 2016-05-07 (×20): 3.375 g via INTRAVENOUS
  Filled 2016-04-30 (×22): qty 50

## 2016-04-30 MED ORDER — PIPERACILLIN-TAZOBACTAM 3.375 G IVPB
3.3750 g | Freq: Three times a day (TID) | INTRAVENOUS | Status: DC
  Filled 2016-04-30 (×2): qty 50

## 2016-04-30 NOTE — Progress Notes (Signed)
STROKE TEAM PROGRESS NOTE   SUBJECTIVE (INTERVAL HISTORY) No family at bedside. Pt is awake and alert and was able to take apple sauce from RN, but not able to swallow big pills. K is low and on IV supplement. Still has global aphasia and right hemiplegia.    OBJECTIVE Temp:  [98.2 F (36.8 C)-102 F (38.9 C)] 98.2 F (36.8 C) (01/01 0800) Pulse Rate:  [67-103] 101 (01/01 0900) Cardiac Rhythm: Sinus tachycardia (01/01 0800) Resp:  [15-28] 18 (01/01 0900) BP: (117-161)/(58-95) 156/79 (01/01 0900) SpO2:  [96 %-100 %] 100 % (01/01 0900)  CBC:   Recent Labs Lab 04/28/16 0502 04/30/16 0541  WBC 10.8 16.9*  HGB 12.7 12.3  HCT 36.9 36.6  MCV 84.4 86.3  PLT 199 [id number]    Basic Metabolic Panel:   Recent Labs Lab 04/28/16 0502 04/30/16 0541  NA 142 143  K 3.1* 2.5*  CL 108 108  CO2 28 26  GLUCOSE 128* 124*  BUN 17 12  CREATININE 0.82 0.66  CALCIUM 8.5* 8.4*   HgbA1c: No results found for: HGBA1C    IMAGING I have personally reviewed the radiological images below and agree with the radiology interpretations.  Ct Head Wo Contrast 04/28/2016   Acute intraparenchymal hemorrhage in the left parietal lobe with hematoma measuring 7.8 x 3.6 x 6.6 cm, 97 cc volume. Intraventricular penetration with a small amount of blood dependent in the lateral ventricle on the right. Mass effect with flattening of the posterior aspect of the left lateral ventricle and 3 mm of left-to-right shift.   Ct Head Wo Contrast 04/29/2016 No increased bleeding or mass effect since yesterday. Some contraction of the hyperdense blood clot in the left parietal region. Interventricular communication without increased amount of intraventricular blood.  Ct Abdomen Pelvis W Contrast 04/27/2016 1. Moderate stool burden with rectal distention, concerning for fecal impaction. No bowel obstruction.  2. Small umbilical hernia contains noninflamed nonobstructed loop of small bowel and anti mesenteric border of  transverse colon.  3. Gallstone without gallbladder inflammation.  4. An incidental finding of potential clinical significance has been found. Left adrenal nodule measures 2.4 cm. There are no prior exams for comparison. In the absence of malignancy history, recommend adrenal protocol CT.  5. **An incidental finding of potential clinical significance has been found. Ill-defined 2.2 cm hypodense lesion in the right lobe of the liver. Recommend nonemergent hepatic protocol MRI, preferably when patient is able tolerate breath hold technique.** multiple additional subcentimeter hepatic lesions are too small to characterize.  6. Atherosclerosis including coronary artery calcifications.  7. Multiple remote lumbar spine compression fractures.   Dg Chest Portable 1 View 04/27/2016 No acute cardiopulmonary process seen.   2D Echo pending   CT head pending   PHYSICAL EXAM  Temp:  [98.2 F (36.8 C)-102 F (38.9 C)] 101.4 F (38.6 C) (01/01 1045) Pulse Rate:  [67-107] 106 (01/01 1040) Resp:  [15-28] 27 (01/01 1040) BP: (117-161)/(58-95) 151/70 (01/01 1040) SpO2:  [92 %-100 %] 99 % (01/01 1040)  General - Well nourished, well developed, in no apparent distress.  Ophthalmologic - Fundi not visualized due to noncooperation.  Cardiovascular - Regular rate and rhythm.  Neuro - awake alert but global aphasia. Able to say "my lord" only with pain stimulation, not following commands. Right side neglect. Not blinking to visual threat on the right. PERRL, left gaze preference, not tracking to the right. Right nasolabial fold mild flattening. Tongue in midline. RUE and RLE spontaneous movement and against gravity.  Right UE and LE 1/5 on pain stimulation. No babinski, DTR 1+. Sensation, coordination and gait not tested.   ASSESSMENT/PLAN [name] is a 83 y.o. female with history of hypertension, dementia, previous CVA, baseline speech abnormality, urinary tract infections, peripheral vascular  disease, colon cancer, chronic kidney disease, and anemia presenting with altered mental status and right hemiplegia.  She did not receive IV t-PA due to [place].  Stroke:  Acute L large parietal ICH with IVH, possibly hypertensive source vs CAA  Resultant  Neglect and right sided weakness  CT - Acute large ICH in the left parietal lobe with IVH.  repeat CT Head 04/29/16 - no increased hemotoma or IVH  Repeat CT head 05/01/16 pending  Consider MRI and MRA once cooperative to rule out CAA or AVM  Carotid Doppler - not indicated  2D Echo - pending  LDL - pending  HgbA1c - pending   VTE prophylaxis - SCDs DIET DYS 3 Room service appropriate? Yes; Fluid consistency: Thin  aspirin 81 mg daily and clopidogrel 75 mg daily prior to admission, now on no antithrombotics due to [place]  Ongoing aggressive stroke risk factor management  Therapy recommendations: pending  Disposition: Pending Wo bowling ball with were 1 kg,  Hypertension  BP 170s/90s in setting of neuro symptoms, hemorrhage  Treated with Cleviprex, now off  Goal SBP set at <160  On home BP meds with Clonidine, Atenolol, Plendil  Long-term BP goal normotensive  Fever  UA WBC 6-30  Urine culture pending  CXR negative  Blood culture sent  On Zosyn for empiric treatment  Other Stroke Risk Factors  Advanced age  Hx stroke/TIA with residue speech difficulty, on DAPT at home  Other Active Problems  Hx of colon cancer  Left Adrenal nodule - will require further workup - recommend adrenal protocol CT  Right Lobe Liver nodule - will require further workup - Recommend nonemergent hepatic protocol MRI  Hypokalemia - 2.5 -> supplement and repeat labs in a.m.  Hospital day # 2  This patient is critically ill due to large left ICH with IVH and at significant risk of neurological worsening, death form hematoma expansion, cerebral edema, brain herniation and hypertensive emergency. This patient's care requires constant  monitoring of vital signs, hemodynamics, respiratory and cardiac monitoring, review of multiple databases, neurological assessment, discussion with family, other specialists and medical decision making of high complexity. I spent 40 minutes of neurocritical care time in the care of this patient.  Marie Hawking, MD PhD Stroke Neurology 04/30/2016 6:23 PM   To contact Stroke Continuity provider, please refer to [email or web address] After hours, contact General Neurology

## 2016-04-30 NOTE — Evaluation (Signed)
Occupational Therapy Evaluation Patient Details Name: [name] MRN: [id number] DOB: 22-Jan-1928 Today's Date: 04/30/2016    History of Present Illness 83 y.o. female with a history of hypertension, dementia, chronic kidney disease, previous stroke , peripheral vascular disease and urinary tract  Infections, transferred from [facility] after CT scan of the head was obtained which showed a large left intracerebral hemorrhage with ventricular extension and mild mass effect.   Clinical Impression   Unsure of PLOF; pt unable to provide information and no family present. Currently pt total assist for ADL and max assist +2 for bed mobility. Pt unable to follow one step commands this session or attend to auditory or tactile stimulus. Pt presenting with impaired cognition and communication, poor sitting balance, limited RUE ROM/strength/sensation, L head turn and poor attention to stimulus impacting her independence and safety with ADL and functional mobility. Recommending SNF for follow up to maximize independence and safety with ADL and functional mobility prior to return home. Pt would benefit from continued skilled OT to address established goals.    Follow Up Recommendations  SNF;Supervision/Assistance - 24 hour    Equipment Recommendations  Other (comment) (TBD at next venue)    Recommendations for Other Services       Precautions / Restrictions Precautions Precautions: Fall Restrictions Weight Bearing Restrictions: No      Mobility Bed Mobility Overal bed mobility: Needs Assistance Bed Mobility: Supine to Sit;Sit to Supine     Supine to sit: Max assist;+2 for physical assistance Sit to supine: Max assist;+2 for physical assistance   General bed mobility comments: Assist for LEs to EOB and back to bed and trunk elevation/controlled decent to bed.  Transfers                 General transfer comment: Not assessed.    Balance Overall balance assessment: Needs  assistance Sitting-balance support: Feet supported;Bilateral upper extremity supported Sitting balance-Leahy Scale: Poor Sitting balance - Comments: Min guard to min assist for sitting balance with posterior lean at times. Postural control: Posterior lean                                  ADL Overall ADL's : Needs assistance/impaired                                       General ADL Comments: Pt currenlty total asssit for ADL. Pt noted to bring hand to mouth using RUE but unable to complete functional task to command.     Vision Additional Comments: Difficult to assess due to impaired cognition/communication. Pt with head turned to L and did not cross midline throughout session. Resisted passive head turn to R side. Not attending to auditory or tactile stimuli.   Perception     Praxis      Pertinent Vitals/Pain Pain Assessment: Faces Faces Pain Scale: Hurts little more Pain Location: L elbow to touch Pain Descriptors / Indicators: Grimacing Pain Intervention(s): Monitored during session     Hand Dominance     Extremity/Trunk Assessment Upper Extremity Assessment Upper Extremity Assessment: RUE deficits/detail;LUE deficits/detail RUE Deficits / Details: No active movement noted. No withdrawl to noxious stimuli. Difficult to assess due to impaired cognition/communication. RUE Sensation: decreased light touch LUE Deficits / Details: Limited movement noted; none to command. Pt able to bring hand to mouth  and wiggle fingers. Formal testing difficult due to impaired cognition/communication. Pt did withdraw to noxious stimuli on R side.   Lower Extremity Assessment Lower Extremity Assessment: Defer to PT evaluation   Cervical / Trunk Assessment Cervical / Trunk Assessment: Normal   Communication Communication Communication: Expressive difficulties;Receptive difficulties   Cognition Arousal/Alertness: Awake/alert Behavior During Therapy: Flat  affect Overall Cognitive Status: Difficult to assess                 General Comments: Pt not following commands or attending to name calling or noxious stimuli.   General Comments       Exercises       Shoulder Instructions      Home Living Family/patient expects to be discharged to:: Unsure Living Arrangements: Children                               Additional Comments: Pt unable to provide home set up information. No family present.      Prior Functioning/Environment          Comments: Unsure. Pt unable to provide information; no family present.        OT Problem List: Decreased strength;Decreased range of motion;Decreased activity tolerance;Impaired balance (sitting and/or standing);Impaired vision/perception;Decreased coordination;Decreased cognition;Decreased safety awareness;Decreased knowledge of use of DME or AE;Decreased knowledge of precautions;Impaired sensation;Impaired tone;Impaired UE functional use;Pain   OT Treatment/Interventions: Self-care/ADL training;Therapeutic exercise;Neuromuscular education;Energy conservation;DME and/or AE instruction;Therapeutic activities;Cognitive remediation/compensation;Visual/perceptual remediation/compensation;Patient/family education;Balance training    OT Goals(Current goals can be found in the care plan section) Acute Rehab OT Goals Patient Stated Goal: none stated OT Goal Formulation: Patient unable to participate in goal setting Time For Goal Achievement: 05/14/16 Potential to Achieve Goals: Good ADL Goals Pt Will Perform Grooming: with min guard assist;sitting Pt Will Transfer to Toilet: with mod assist;stand pivot transfer;bedside commode Additional ADL Goal #1: Pt will follow one step command 50% of the time in a minimally distracting environment. Additional ADL Goal #2: Pt will attend to objects in R visual field 50% of the time with mod multimodal cues.  OT Frequency: Min 2X/week   Barriers  to D/C:            Co-evaluation PT/OT/SLP Co-Evaluation/Treatment: Yes Reason for Co-Treatment: Complexity of the patient's impairments (multi-system involvement);Necessary to address cognition/behavior during functional activity;For patient/therapist safety   OT goals addressed during session: ADL's and self-care      End of Session Nurse Communication: Mobility status;Other (comment) (Pt grimacing to touch at L elbow)  Activity Tolerance: Patient tolerated treatment well Patient left: in bed;with call bell/phone within reach   Time: 1553-1606 OT Time Calculation (min): 13 min Charges:  OT General Charges $OT Visit: 1 Procedure OT Evaluation $OT Eval Moderate Complexity: 1 Procedure G-Codes:     Binnie Kand M.S., OTR/L Pager: [phone]  04/30/2016, 4:26 PM

## 2016-04-30 NOTE — Progress Notes (Addendum)
CRITICAL VALUE ALERT  Critical value received: potassium 2.5  Date of notification:  04/30/16  Time of notification:  0715  Critical value read back: yes  Nurse who received alert:  Loma Boston, RN  MD notified (1st page):  Miachel Roux, NP  Time of first page:  0715  MD notified (2nd page): Miachel Roux, NP  Time of second page: [phone]  3rd page: 0800 to Dr. Erlinda Hong  Responding MD: . Reported to on-coming nurse who sent text page by [facility] Dr. Erlinda Hong   Time MD responded:  [phone], orders received. Another PIV site needed, several attempts to establish requiring IV team RN assistance prior to potassium replacement.

## 2016-04-30 NOTE — Evaluation (Signed)
Physical Therapy Evaluation Patient Details Name: [name] MRN: [id number] DOB: Jan 10, 1928 Today's Date: 04/30/2016   History of Present Illness  83 y.o. female with a history of hypertension, dementia, chronic kidney disease, previous stroke , peripheral vascular disease and urinary tract  Infections, transferred from [facility] after CT scan of the head was obtained which showed a large left intracerebral hemorrhage with ventricular extension and mild mass effect.  Clinical Impression  Pt admitted with above diagnosis. Pt currently with functional limitations due to the deficits listed below (see PT Problem List). Pt was able to sit EOB x 7 minutes with min to min guard assist.  Did not follow any commands.  Non verbal during session as well. Will most likely need SNF.  Will folllow acutely.  Pt will benefit from skilled PT to increase their independence and safety with mobility to allow discharge to the venue listed below.      Follow Up Recommendations SNF;Supervision/Assistance - 24 hour    Equipment Recommendations  None recommended by PT (TBA)    Recommendations for Other Services       Precautions / Restrictions Precautions Precautions: Fall Restrictions Weight Bearing Restrictions: No      Mobility  Bed Mobility Overal bed mobility: Needs Assistance Bed Mobility: Supine to Sit;Sit to Supine     Supine to sit: Max assist;+2 for physical assistance Sit to supine: Max assist;+2 for physical assistance   General bed mobility comments: Assist for LEs to EOB and back to bed and trunk elevation/controlled decent to bed.  Transfers                 General transfer comment: Not assessed.  Ambulation/Gait                Stairs            Wheelchair Mobility    Modified Rankin (Stroke Patients Only)       Balance Overall balance assessment: Needs assistance Sitting-balance support: Feet supported;Bilateral upper extremity supported Sitting  balance-Leahy Scale: Poor Sitting balance - Comments: Min guard to min assist for sitting balance with posterior lean at times.  Sat a total of 7 minutes  Would not kick LEs to command or move UEs to command Postural control: Posterior lean                                   Pertinent Vitals/Pain Pain Assessment: Faces Faces Pain Scale: Hurts little more Pain Location: L elbow to touch Pain Descriptors / Indicators: Grimacing Pain Intervention(s): Limited activity within patient's tolerance;Monitored during session;Repositioned   VSS Home Living Family/patient expects to be discharged to:: Unsure Living Arrangements: Children               Additional Comments: Pt unable to provide home set up information. No family present.    Prior Function           Comments: Unsure.  Per nurse pt lived with daughter PTA for last 12 years and attended [facility].     Hand Dominance        Extremity/Trunk Assessment   Upper Extremity Assessment Upper Extremity Assessment: Defer to OT evaluation RUE Deficits / Details: No active movement noted. No withdrawl to noxious stimuli. Difficult to assess due to impaired cognition/communication. RUE Sensation: decreased light touch LUE Deficits / Details: Limited movement noted; none to command. Pt able to bring hand to mouth and wiggle  fingers. Formal testing difficult due to impaired cognition/communication. Pt did withdraw to noxious stimuli on R side.    Lower Extremity Assessment Lower Extremity Assessment: Difficult to assess due to impaired cognition;RLE deficits/detail;LLE deficits/detail RLE Deficits / Details: Pt did not move to command.  Did withdraw to pain LLE Deficits / Details: Did not move to command. Did withdraw to pain.    Cervical / Trunk Assessment Cervical / Trunk Assessment: Normal  Communication   Communication: Expressive difficulties;Receptive difficulties  Cognition Arousal/Alertness:  Awake/alert Behavior During Therapy: Flat affect Overall Cognitive Status: Difficult to assess                 General Comments: Pt not following commands or attending to name calling or noxious stimuli.    General Comments      Exercises     Assessment/Plan    PT Assessment Patient needs continued PT services  PT Problem List Decreased activity tolerance;Decreased balance;Decreased mobility;Decreased strength;Decreased coordination;Decreased cognition;Decreased knowledge of use of DME;Decreased safety awareness;Decreased knowledge of precautions;Cardiopulmonary status limiting activity;Pain          PT Treatment Interventions DME instruction;Gait training;Functional mobility training;Therapeutic exercise;Therapeutic activities;Balance training;Patient/family education;Cognitive remediation    PT Goals (Current goals can be found in the Care Plan section)  Acute Rehab PT Goals Patient Stated Goal: none stated PT Goal Formulation: Patient unable to participate in goal setting Time For Goal Achievement: 05/14/16 Potential to Achieve Goals: Fair    Frequency Min 2X/week   Barriers to discharge Decreased caregiver support      Co-evaluation PT/OT/SLP Co-Evaluation/Treatment: Yes Reason for Co-Treatment: Complexity of the patient's impairments (multi-system involvement) PT goals addressed during session: Mobility/safety with mobility OT goals addressed during session: ADL's and self-care       End of Session   Activity Tolerance: Patient limited by fatigue Patient left: in bed;with call bell/phone within reach;with SCD's reapplied Nurse Communication: Mobility status;Need for lift equipment         Time: [id number] PT Time Calculation (min) (ACUTE ONLY): 15 min   Charges:   PT Evaluation $PT Eval Moderate Complexity: 1 Procedure     PT G Codes:        Paula Busenbark F Marcianna Daily 2016/05/01, 4:36 PM  [facility] Acute Rehabilitation [phone]  (pager)

## 2016-04-30 NOTE — Evaluation (Signed)
Speech Language Pathology Evaluation Patient Details Name: [name] MRN: [id number] DOB: 12/09/1927 Today's Date: 04/30/2016 Time: [id number] SLP Time Calculation (min) (ACUTE ONLY): 14 min  Problem List:  Patient Active Problem List   Diagnosis Date Noted  . Hypokalemia   . Malignant hypertension   . Acute encephalopathy 04/28/2016  . Urinary tract infection without hematuria 04/28/2016  . Alzheimer's dementia 04/28/2016  . HTN (hypertension) 04/28/2016  . PVD (peripheral vascular disease) ([place]) 04/28/2016  . ICH (intracerebral hemorrhage) ([place]) 04/28/2016   Past Medical History:  Past Medical History:  Diagnosis Date  . Alzheimer's dementia   . Anemia   . CKD (chronic kidney disease)    stage 3  . Colon cancer ([place])   . Constipation   . CVA (cerebral vascular accident) ([place])   . Dementia   . Hypertension   . PVD (peripheral vascular disease) ([place])   . UTI (urinary tract infection)    Past Surgical History:  Past Surgical History:  Procedure Laterality Date  . NO PAST SURGERIES     HPI:  83 y.o.femalewith history of hypertension, dementia, previous CVA, baseline speech abnormality, urinary tract infections, peripheral vascular disease, colon cancer, chronic kidney disease, and anemiapresenting with altered mental status and right hemiplegia.Shedidnot receive IV t-PA due to [place] (left parietal lobe). Patient with neglect and right sided weakness.    Assessment / Plan / Recommendation Clinical Impression  Cognitive-linguistic evaluation complete. Patient lethargic but aroused with max verbal and tactile cueing. Patient with impairments in the areas of focused attention, auditory comprehension, and verbal expression, amongst other higher level cognitive functions. Verbal output minimal with giggling and 2 x appropriate responses to painful stimuli. Patient unable to follow commands with max contextual and tactile cueing provided for participation in basic, familiar  ADLs. Unclear specifics regarding baseline cognitive status however patient with known impairments. Will benefit from skilled SLP services for trial therapy with focus on ability to communicate basic needs and participate in basic ADLs.     SLP Assessment  Patient needs continued Speech Lanaguage Pathology Services    Follow Up Recommendations  Skilled Nursing facility    Frequency and Duration min 3x week  2 weeks      SLP Evaluation Cognition  Overall Cognitive Status: No family/caregiver present to determine baseline cognitive functioning Arousal/Alertness: Lethargic Orientation Level:  (largely non-verbal) Attention: Focused Focused Attention: Impaired Focused Attention Impairment: Verbal basic;Functional basic Awareness: Impaired Awareness Impairment: Intellectual impairment Problem Solving: Impaired Problem Solving Impairment: Verbal basic;Functional basic       Comprehension  Auditory Comprehension Overall Auditory Comprehension: Impaired Yes/No Questions: Impaired Basic Biographical Questions: 0-25% accurate Commands: Impaired One Step Basic Commands: 0-24% accurate EffectiveTechniques: Other (Comment) (max contextual cues) Visual Recognition/Discrimination Discrimination: Not tested Reading Comprehension Reading Status: Not tested    Expression Verbal Expression Overall Verbal Expression: Impaired Initiation: Impaired Automatic Speech:  (giggling, 2 x intact automatic speech in response to pain)   Oral / Motor  Oral Motor/Sensory Function Overall Oral Motor/Sensory Function: Within functional limits (based on observation during smile and mastication) Motor Speech Overall Motor Speech:  (unable to determine, appears [facility])   GO                   Gabriel Rainwater MA, CCC-SLP [phone]  [place] Meryl 04/30/2016, 9:46 AM

## 2016-04-30 NOTE — Evaluation (Addendum)
Clinical/Bedside Swallow Evaluation Patient Details  Name: [name] MRN: [id number] Date of Birth: Jul 04, 1927  Today's Date: 04/30/2016 Time: SLP Start Time (ACUTE ONLY): 0855 SLP Stop Time (ACUTE ONLY): 0915 SLP Time Calculation (min) (ACUTE ONLY): 20 min  Past Medical History:  Past Medical History:  Diagnosis Date  . Alzheimer's dementia   . Anemia   . CKD (chronic kidney disease)    stage 3  . Colon cancer ([place])   . Constipation   . CVA (cerebral vascular accident) ([place])   . Dementia   . Hypertension   . PVD (peripheral vascular disease) ([place])   . UTI (urinary tract infection)    Past Surgical History:  Past Surgical History:  Procedure Laterality Date  . NO PAST SURGERIES     HPI:  83 y.o.femalewith history of hypertension, dementia, previous CVA, baseline speech abnormality, urinary tract infections, peripheral vascular disease, colon cancer, chronic kidney disease, and anemiapresenting with altered mental status and right hemiplegia.Shedidnot receive IV t-PA due to [place] (left parietal lobe). Patient with neglect and right sided weakness.    Assessment / Plan / Recommendation Clinical Impression  Patient requires max verbal and tactile cueing to arouse however one ice chip placed to lips, eyes opened, patient smiled, and able to maintain attention to po trails for 15-20 minutes for intake of soft solids and thin liquids without overt indication of aspiration. Vocal quality (via laughter and reponse to painful stimuli only) appeared clear. Oral phase consistently delayed but functional. Recommend initiation of a po diet with full supervision. Patient will require cueing for arousal and assistance with feeding. Aspiration risk increased given cognitive deficits.     Aspiration Risk  Moderate aspiration risk    Diet Recommendation Dysphagia 3 (Mech soft);Thin liquid   Liquid Administration via: Cup;Straw Medication Administration: Crushed with puree Supervision:  Staff to assist with self feeding;Full supervision/cueing for compensatory strategies Compensations: Slow rate;Small sips/bites Postural Changes: Seated upright at 90 degrees    Other  Recommendations Oral Care Recommendations: Oral care BID   Follow up Recommendations Skilled Nursing facility      Frequency and Duration min 3x week  2 weeks       Prognosis Prognosis for Safe Diet Advancement: Fair      Swallow Study   General HPI: 83 y.o.femalewith history of hypertension, dementia, previous CVA, baseline speech abnormality, urinary tract infections, peripheral vascular disease, colon cancer, chronic kidney disease, and anemiapresenting with altered mental status and right hemiplegia.Shedidnot receive IV t-PA due to [place] (left parietal lobe). Patient with neglect and right sided weakness.  Type of Study: Bedside Swallow Evaluation Previous Swallow Assessment: evaluated 04/28/16 upon admission to [facility], severe oral dysphagia with oral holding.  Diet Prior to this Study: NPO Temperature Spikes Noted: No Respiratory Status: Room air History of Recent Intubation: No Behavior/Cognition: Alert;Cooperative;Pleasant mood;Requires cueing Oral Cavity Assessment: Within Functional Limits Oral Care Completed by SLP: No Oral Cavity - Dentition: Missing dentition (missing upper dentition) Vision: Functional for self-feeding Self-Feeding Abilities: Needs assist Patient Positioning: Upright in bed Baseline Vocal Quality: Normal (minimal verbalizations/vocalizations) Volitional Cough: Cognitively unable to elicit Volitional Swallow: Unable to elicit    Oral/Motor/Sensory Function Overall Oral Motor/Sensory Function: Within functional limits (based on observation during smile and mastication)   Ice Chips Ice chips: Impaired Presentation: Spoon Oral Phase Impairments: Impaired mastication Oral Phase Functional Implications: Prolonged oral transit   Thin Liquid Thin Liquid:  Impaired Presentation: Cup;Straw Oral Phase Impairments: Impaired mastication Oral Phase Functional Implications: Prolonged oral transit  Nectar Thick Nectar Thick Liquid: Not tested   Honey Thick Honey Thick Liquid: Not tested   Puree Puree: Impaired Presentation: Spoon Oral Phase Impairments: Impaired mastication Oral Phase Functional Implications: Prolonged oral transit   Solid   GO   Solid: Impaired Presentation: Spoon Oral Phase Impairments: Impaired mastication Oral Phase Functional Implications: Prolonged oral transit       Layonna Dobie MA, CCC-SLP [phone]  [place] Meryl 04/30/2016,9:38 AM

## 2016-04-30 NOTE — Progress Notes (Signed)
Pharmacy Antibiotic Note  [name] is a 83 y.o. female admitted on 04/28/2016 with pneumonia.  Pharmacy has been consulted for zosyn dosing.  Plan: Zosyn 3.375g IV q8h (4 hour infusion).  Monitor culture data, renal function and clinical course  Height: 5\' 4"  (162.6 cm) Weight: 125 lb (56.7 kg) IBW/kg (Calculated) : 54.7  Temp (24hrs), Avg:100.2 F (37.9 C), Min:98.2 F (36.8 C), Max:102 F (38.9 C)   Recent Labs Lab 04/27/16 1810 04/27/16 1943 04/27/16 2240 04/28/16 0502 04/30/16 0541  WBC 10.6  --   --  10.8 16.9*  CREATININE 0.77  --   --  0.82 0.66  LATICACIDVEN  --  2.3* 1.4  --   --     Estimated Creatinine Clearance: 43.6 mL/min (by C-G formula based on SCr of 0.66 mg/dL).    No Known Allergies   Andrey Cota. Diona Foley, PharmD, [place] Clinical Pharmacist Pager [phone] 04/30/2016 12:54 PM

## 2016-04-30 NOTE — Progress Notes (Signed)
Received call from RN stating patient with mastication difficulties with meat and greens during lunch today. No trouble documented with pureed solids or liquids. Will downgrade to dysphagia 1 (puree) at this time and f/u in am 1/2.   [place], [place] [phone]

## 2016-04-30 NOTE — Progress Notes (Signed)
MD aware of continued fever today. Orders received.

## 2016-04-30 NOTE — Progress Notes (Addendum)
Attempted 3x to complete I&O cath for UA but unsuccessful. MD notified. Patient has been voiding well but incontinent. Will continue to monitor.   Update: UA d/c per MD.

## 2016-04-30 NOTE — Progress Notes (Signed)
OT Cancellation Note  Patient Details Name: [name] MRN: [id number] DOB: 08-07-1927   Cancelled Treatment:    Reason Eval/Treat Not Completed: Patient not medically ready (Active bedrest orders). Please d/c bedrest orders/update activity orders when pt medically appropriate for therapy. Will follow up for OT eval as time allows.  Binnie Kand M.S., OTR/L Pager: [phone]  04/30/2016, 8:48 AM

## 2016-05-01 ENCOUNTER — Inpatient Hospital Stay (HOSPITAL_COMMUNITY): Payer: Medicare (Managed Care)

## 2016-05-01 DIAGNOSIS — I6350 Cerebral infarction due to unspecified occlusion or stenosis of unspecified cerebral artery: Secondary | ICD-10-CM

## 2016-05-01 DIAGNOSIS — D72829 Elevated white blood cell count, unspecified: Secondary | ICD-10-CM

## 2016-05-01 LAB — BASIC METABOLIC PANEL
Anion gap: 9 (ref 5–15)
BUN: 7 mg/dL (ref 6–20)
CALCIUM: 8 mg/dL — AB (ref 8.9–10.3)
CO2: 24 mmol/L (ref 22–32)
Chloride: 105 mmol/L (ref 101–111)
Creatinine, Ser: 0.65 mg/dL (ref 0.44–1.00)
GFR calc non Af Amer: >60 mL/min (ref >60)
Glucose, Bld: 132 mg/dL — ABNORMAL HIGH (ref 65–99)
Potassium: 2.8 mmol/L — ABNORMAL LOW (ref 3.5–5.1)
Sodium: 138 mmol/L (ref 135–145)

## 2016-05-01 LAB — CBC
HEMATOCRIT: 34.7 % — AB (ref 36.0–46.0)
Hemoglobin: 11.5 g/dL — ABNORMAL LOW (ref 12.0–15.0)
MCH: 28.4 pg (ref 26.0–34.0)
MCHC: 33.1 g/dL (ref 30.0–36.0)
MCV: 85.7 fL (ref 78.0–100.0)
Platelets: 177 10*3/uL (ref 150–400)
RBC: 4.05 MIL/uL (ref 3.87–5.11)
RDW: 12.8 % (ref 11.5–15.5)
WBC: 12.6 10*3/uL — ABNORMAL HIGH (ref 4.0–10.5)

## 2016-05-01 LAB — LIPID PANEL
CHOL/HDL RATIO: 3.4 ratio
Cholesterol: 139 mg/dL (ref 0–200)
HDL: 41 mg/dL (ref >40)
LDL CALC: 84 mg/dL (ref 0–99)
Triglycerides: 69 mg/dL (ref <150)
VLDL: 14 mg/dL (ref 0–40)

## 2016-05-01 LAB — VITAMIN B12: Vitamin B-12: 525 pg/mL (ref 180–914)

## 2016-05-01 LAB — HEMOGLOBIN A1C
HEMOGLOBIN A1C: 5.3 % (ref 4.8–5.6)
Mean Plasma Glucose: 105 mg/dL

## 2016-05-01 LAB — ECHOCARDIOGRAM COMPLETE
Height: 64 in
WEIGHTICAEL: 2000.01 [oz_av]

## 2016-05-01 MED ORDER — CLONIDINE HCL 0.1 MG PO TABS
0.1000 mg | ORAL_TABLET | Freq: Three times a day (TID) | ORAL | Status: DC
  Administered 2016-05-01 – 2016-05-03 (×7): 0.1 mg via ORAL
  Filled 2016-05-01 (×7): qty 1

## 2016-05-01 MED ORDER — ATENOLOL 25 MG PO TABS
50.0000 mg | ORAL_TABLET | Freq: Every day | ORAL | Status: DC
  Administered 2016-05-01 – 2016-05-07 (×7): 50 mg via ORAL
  Filled 2016-05-01: qty 1
  Filled 2016-05-01: qty 2
  Filled 2016-05-01: qty 1
  Filled 2016-05-01: qty 2
  Filled 2016-05-01: qty 1
  Filled 2016-05-01 (×2): qty 2

## 2016-05-01 MED ORDER — LABETALOL HCL 5 MG/ML IV SOLN
10.0000 mg | INTRAVENOUS | Status: DC | PRN
  Administered 2016-05-01: 40 mg via INTRAVENOUS
  Administered 2016-05-01 – 2016-05-02 (×2): 20 mg via INTRAVENOUS
  Administered 2016-05-02 – 2016-05-03 (×6): 40 mg via INTRAVENOUS
  Filled 2016-05-01 (×3): qty 8
  Filled 2016-05-01: qty 4
  Filled 2016-05-01: qty 8
  Filled 2016-05-01: qty 4
  Filled 2016-05-01 (×3): qty 8

## 2016-05-01 MED ORDER — LISINOPRIL 20 MG PO TABS
20.0000 mg | ORAL_TABLET | Freq: Every day | ORAL | Status: DC
  Administered 2016-05-01 – 2016-05-02 (×2): 20 mg via ORAL
  Filled 2016-05-01 (×2): qty 1

## 2016-05-01 MED ORDER — PANTOPRAZOLE SODIUM 40 MG PO TBEC
40.0000 mg | DELAYED_RELEASE_TABLET | Freq: Every day | ORAL | Status: DC
Start: 2016-05-01 — End: 2016-05-07
  Administered 2016-05-04 – 2016-05-07 (×4): 40 mg via ORAL
  Filled 2016-05-01 (×5): qty 1

## 2016-05-01 MED ORDER — HEPARIN SODIUM (PORCINE) 5000 UNIT/ML IJ SOLN
5000.0000 [IU] | Freq: Two times a day (BID) | INTRAMUSCULAR | Status: DC
  Administered 2016-05-01 – 2016-05-07 (×12): 5000 [IU] via SUBCUTANEOUS
  Filled 2016-05-01 (×12): qty 1

## 2016-05-01 MED ORDER — POTASSIUM CHLORIDE CRYS ER 20 MEQ PO TBCR
40.0000 meq | EXTENDED_RELEASE_TABLET | ORAL | Status: AC
  Administered 2016-05-01 (×3): 40 meq via ORAL
  Filled 2016-05-01 (×3): qty 2

## 2016-05-01 NOTE — Progress Notes (Signed)
  Echocardiogram 2D Echocardiogram has been performed.  [name] 05/01/2016, 3:49 PM

## 2016-05-01 NOTE — Progress Notes (Signed)
STROKE TEAM PROGRESS NOTE   SUBJECTIVE (INTERVAL HISTORY) No family at bedside. Pt is awake and alert. Still has global aphasia and right hemiplegia and right neglect.  Passed swallow, will give K supplement. Still on low dose cleviprex and will increase po BP meds.    OBJECTIVE Temp:  [98.1 F (36.7 C)-100.8 F (38.2 C)] 100.1 F (37.8 C) (01/02 1200) Pulse Rate:  [53-106] 83 (01/02 1430) Cardiac Rhythm: Normal sinus rhythm (01/02 0800) Resp:  [11-29] 21 (01/02 1430) BP: (98-185)/(47-115) 153/66 (01/02 1430) SpO2:  [95 %-100 %] 97 % (01/02 1430)  CBC:   Recent Labs Lab 04/30/16 0541 05/01/16 0406  WBC 16.9* 12.6*  HGB 12.3 11.5*  HCT 36.6 34.7*  MCV 86.3 85.7  PLT 164 [id number]    Basic Metabolic Panel:   Recent Labs Lab 04/30/16 0541 05/01/16 0406  NA 143 138  K 2.5* 2.8*  CL 108 105  CO2 26 24  GLUCOSE 124* 132*  BUN 12 7  CREATININE 0.66 0.65  CALCIUM 8.4* 8.0*   HgbA1c:  Lab Results  Component Value Date   HGBA1C 5.3 04/30/2016      IMAGING I have personally reviewed the radiological images below and agree with the radiology interpretations.  Ct Head Wo Contrast 04/28/2016   Acute intraparenchymal hemorrhage in the left parietal lobe with hematoma measuring 7.8 x 3.6 x 6.6 cm, 97 cc volume. Intraventricular penetration with a small amount of blood dependent in the lateral ventricle on the right. Mass effect with flattening of the posterior aspect of the left lateral ventricle and 3 mm of left-to-right shift.   04/29/2016 No increased bleeding or mass effect since yesterday. Some contraction of the hyperdense blood clot in the left parietal region. Interventricular communication without increased amount of intraventricular blood.  05/01/2016 IMPRESSION: Evolving LEFT intraparenchymal hematoma with intraventricular extension. 2 mm LEFT-to-RIGHT midline shift. Small amount of residual re- distributed subarachnoid hemorrhage.   Ct Abdomen Pelvis W  Contrast 04/27/2016 1. Moderate stool burden with rectal distention, concerning for fecal impaction. No bowel obstruction.  2. Small umbilical hernia contains noninflamed nonobstructed loop of small bowel and anti mesenteric border of transverse colon.  3. Gallstone without gallbladder inflammation.  4. An incidental finding of potential clinical significance has been found. Left adrenal nodule measures 2.4 cm. There are no prior exams for comparison. In the absence of malignancy history, recommend adrenal protocol CT.  5. **An incidental finding of potential clinical significance has been found. Ill-defined 2.2 cm hypodense lesion in the right lobe of the liver. Recommend nonemergent hepatic protocol MRI, preferably when patient is able tolerate breath hold technique.** multiple additional subcentimeter hepatic lesions are too small to characterize.  6. Atherosclerosis including coronary artery calcifications.  7. Multiple remote lumbar spine compression fractures.   Dg Chest Portable 1 View 04/27/2016 No acute cardiopulmonary process seen.   2D Echo pending    PHYSICAL EXAM  Temp:  [98.1 F (36.7 C)-100.8 F (38.2 C)] 100.1 F (37.8 C) (01/02 1200) Pulse Rate:  [53-106] 83 (01/02 1430) Resp:  [11-29] 21 (01/02 1430) BP: (98-185)/(47-115) 153/66 (01/02 1430) SpO2:  [95 %-100 %] 97 % (01/02 1430)  General - Well nourished, well developed, in no apparent distress.  Ophthalmologic - Fundi not visualized due to noncooperation.  Cardiovascular - Regular rate and rhythm.  Neuro - awake alert but global aphasia. Able to say "my lord" only with pain stimulation, not following commands. Right side neglect. Not blinking to visual threat on the right. PERRL,  left gaze preference, not tracking to the right. Right nasolabial fold mild flattening. Tongue in midline. RUE and RLE spontaneous movement and against gravity. Right UE and LE 1/5 on pain stimulation. No babinski, DTR 1+. Sensation,  coordination and gait not tested.   ASSESSMENT/PLAN [name] is a 83 y.o. female with history of hypertension, dementia, previous CVA, baseline speech abnormality, urinary tract infections, peripheral vascular disease, colon cancer, chronic kidney disease, and anemia presenting with altered mental status and right hemiplegia.  She did not receive IV t-PA due to [place].  Stroke:  Acute L large parietal ICH with IVH, possibly hypertensive source vs CAA  Resultant  Neglect and right sided weakness  CT - Acute large ICH in the left parietal lobe with IVH.  repeat CT Head 04/29/16 - no increased hemotoma or IVH  Repeat CT head 05/01/16 - stable hematoma and IVH  Consider MRI and MRA once cooperative to rule out CAA or AVM  Carotid Doppler - not indicated  2D Echo - pending  LDL - 84  HgbA1c - 5.3  VTE prophylaxis - heparin subq DIET - DYS 1 Room service appropriate? Yes; Fluid consistency: Thin  aspirin 81 mg daily and clopidogrel 75 mg daily prior to admission, now on no antithrombotics due to [place]  Ongoing aggressive stroke risk factor management  Therapy recommendations: SNF  Disposition: Pending  Hypertension  BP 170s/90s in setting of neuro symptoms, hemorrhage  Treated with Cleviprex  Wean off cleviprex as able Goal SBP set at <160 On home BP meds with Clonidine, Atenolol, Plendil Long-term BP goal normotensive  Fever, intermittent  UA WBC 6-30  Urine culture pending, NGTD  CXR negative  Blood culture sent  On Zosyn for empiric treatment  Other Stroke Risk Factors  Advanced age  Hx stroke/TIA with residue speech difficulty, on DAPT at home  Other Active Problems  Hx of colon cancer  Left Adrenal nodule - will require further workup - recommend adrenal protocol CT  Right Lobe Liver nodule - will require further workup - Recommend nonemergent hepatic protocol MRI  Hypokalemia - 2.5 -> 2.8 supplement and repeat labs in a.m.  Hospital day #  3  This patient is critically ill due to large left ICH with IVH and at significant risk of neurological worsening, death form hematoma expansion, cerebral edema, brain herniation and hypertensive emergency. This patient's care requires constant monitoring of vital signs, hemodynamics, respiratory and cardiac monitoring, review of multiple databases, neurological assessment, discussion with family, other specialists and medical decision making of high complexity. I spent 40 minutes of neurocritical care time in the care of this patient.  Rosalin Hawking, MD PhD Stroke Neurology 05/01/2016 3:05 PM   To contact Stroke Continuity provider, please refer to [email or web address] After hours, contact General Neurology

## 2016-05-01 NOTE — Progress Notes (Signed)
Pharmacy Antibiotic Note  [name] is a 83 y.o. female admitted on 04/28/2016 with pneumonia.  Pharmacy has been consulted for zosyn dosing. Tmax is 101.4 and WBC is elevated at 12.6. Scr is stable and <1.   Plan: Zosyn 3.375g IV q8h (4 hour infusion).  Monitor culture data, renal function and clinical course *Pharmacy will sign off as no dose adjustments are anticipated. Thank you for the consult!  Height: 5\' 4"  (162.6 cm) Weight: 125 lb (56.7 kg) IBW/kg (Calculated) : 54.7  Temp (24hrs), Avg:99.8 F (37.7 C), Min:98.2 F (36.8 C), Max:101.4 F (38.6 C)   Recent Labs Lab 04/27/16 1810 04/27/16 1943 04/27/16 2240 04/28/16 0502 04/30/16 0541 05/01/16 0406  WBC 10.6  --   --  10.8 16.9* 12.6*  CREATININE 0.77  --   --  0.82 0.66 0.65  LATICACIDVEN  --  2.3* 1.4  --   --   --     Estimated Creatinine Clearance: 43.6 mL/min (by C-G formula based on SCr of 0.65 mg/dL).    No Known Allergies  Salome Arnt, PharmD, BCPS Pager # [phone] 05/01/2016 7:28 AM

## 2016-05-02 LAB — CBC
HCT: 37.4 % (ref 36.0–46.0)
HEMOGLOBIN: 12.6 g/dL (ref 12.0–15.0)
MCH: 28.8 pg (ref 26.0–34.0)
MCHC: 33.7 g/dL (ref 30.0–36.0)
MCV: 85.6 fL (ref 78.0–100.0)
PLATELETS: 246 10*3/uL (ref 150–400)
RBC: 4.37 MIL/uL (ref 3.87–5.11)
RDW: 12.8 % (ref 11.5–15.5)
WBC: 11.1 10*3/uL — AB (ref 4.0–10.5)

## 2016-05-02 LAB — BASIC METABOLIC PANEL
Anion gap: 10 (ref 5–15)
BUN: 12 mg/dL (ref 6–20)
CHLORIDE: 103 mmol/L (ref 101–111)
CO2: 25 mmol/L (ref 22–32)
CREATININE: 0.81 mg/dL (ref 0.44–1.00)
Calcium: 8.4 mg/dL — ABNORMAL LOW (ref 8.9–10.3)
Glucose, Bld: 122 mg/dL — ABNORMAL HIGH (ref 65–99)
POTASSIUM: 2.9 mmol/L — AB (ref 3.5–5.1)
SODIUM: 138 mmol/L (ref 135–145)

## 2016-05-02 LAB — MAGNESIUM: MAGNESIUM: 1.7 mg/dL (ref 1.7–2.4)

## 2016-05-02 MED ORDER — POTASSIUM CHLORIDE 20 MEQ PO PACK
40.0000 meq | PACK | ORAL | Status: AC
  Administered 2016-05-02 (×2): 40 meq via ORAL
  Filled 2016-05-02 (×2): qty 2

## 2016-05-02 MED ORDER — MAGNESIUM SULFATE 2 GM/50ML IV SOLN
2.0000 g | Freq: Once | INTRAVENOUS | Status: AC
Start: 2016-05-02 — End: 2016-05-02
  Administered 2016-05-02: 2 g via INTRAVENOUS
  Filled 2016-05-02: qty 50

## 2016-05-02 MED ORDER — POTASSIUM CHLORIDE CRYS ER 20 MEQ PO TBCR
40.0000 meq | EXTENDED_RELEASE_TABLET | ORAL | Status: DC
  Filled 2016-05-02: qty 2

## 2016-05-02 MED ORDER — ENSURE ENLIVE PO LIQD
237.0000 mL | Freq: Three times a day (TID) | ORAL | Status: DC
  Administered 2016-05-02 – 2016-05-07 (×10): 237 mL via ORAL

## 2016-05-02 MED ORDER — LISINOPRIL 20 MG PO TABS
20.0000 mg | ORAL_TABLET | Freq: Two times a day (BID) | ORAL | Status: DC
  Administered 2016-05-02 – 2016-05-07 (×10): 20 mg via ORAL
  Filled 2016-05-02 (×10): qty 1

## 2016-05-02 NOTE — Progress Notes (Signed)
Patient able to take medications in puree this AM but overall poor oral intake. MD aware. Orders received for dietician consult and supplement. Will continue to monitor.

## 2016-05-02 NOTE — Progress Notes (Signed)
Physical Therapy Treatment Patient Details Name: TAKERRA QUINTILIANI MRN: [id number] DOB: May 18, 1927 Today's Date: 05/02/2016    History of Present Illness 83 y.o. female with a history of hypertension, dementia, chronic kidney disease, previous stroke , peripheral vascular disease and urinary tract  Infections, transferred from [facility] after CT scan of the head was obtained which showed a large left intracerebral hemorrhage with ventricular extension and mild mass effect.    PT Comments    Patient continues to require assist for sitting EOB. Tolerated sitting EOB ~15 minutes taking medications. Pt not following any verbal commands. Withdraws from noxious stimulus on all extremities except RUE. Pt with full body tremors during startled movements - seems fearful of falling. Very rigid during rolling and bed mobility. Pt with left gaze preference. Continue to recommend SNF. Will follow.   Follow Up Recommendations  SNF;Supervision/Assistance - 24 hour     Equipment Recommendations  None recommended by PT    Recommendations for Other Services       Precautions / Restrictions Precautions Precautions: Fall Restrictions Weight Bearing Restrictions: No    Mobility  Bed Mobility Overal bed mobility: Needs Assistance Bed Mobility: Sidelying to Sit;Supine to Sit   Sidelying to sit: +2 for physical assistance;Total assist Supine to sit: Total assist;+2 for physical assistance Sit to supine: Total assist;+2 for physical assistance   General bed mobility comments: pt with full body tremor like motion with startle to movement. pt with L eye deviation and neck rotation.   Transfers                 General transfer comment: not assessed due to limited ability to sustain EOB sitting without (A)   Ambulation/Gait                 Stairs            Wheelchair Mobility    Modified Rankin (Stroke Patients Only) Modified Rankin (Stroke Patients Only) Pre-Morbid Rankin Score:  Moderately severe disability Modified Rankin: Severe disability     Balance Overall balance assessment: Needs assistance Sitting-balance support: No upper extremity supported;Feet unsupported Sitting balance-Leahy Scale: Poor Sitting balance - Comments: pt with posterior lean and tremors with anterior weight shift as if fearful of falling forward. When support is removed, able to prevent complete LOB posteriorly using core. Postural control: Posterior lean                          Cognition Arousal/Alertness: Awake/alert Behavior During Therapy: Flat affect Overall Cognitive Status: Difficult to assess                 General Comments: pt not following commands. pt with no righting response to LOB. pt with verbalizations only to startle and pain. Able to mimic smiling.    Exercises      General Comments        Pertinent Vitals/Pain Pain Assessment: Faces Pain Score: 4  Faces Pain Scale: Hurts little more Pain Location: grimace to sensory assessment Pain Descriptors / Indicators: Grimacing Pain Intervention(s): Monitored during session;RN gave pain meds during session;Repositioned    Home Living                      Prior Function            PT Goals (current goals can now be found in the care plan section) Acute Rehab PT Goals Patient Stated Goal: none stated Progress towards  PT goals: Not progressing toward goals - comment (limited by aphasia)    Frequency    Min 2X/week      PT Plan Current plan remains appropriate    Co-evaluation PT/OT/SLP Co-Evaluation/Treatment: Yes Reason for Co-Treatment: Complexity of the patient's impairments (multi-system involvement);Necessary to address cognition/behavior during functional activity;For patient/therapist safety;To address functional/ADL transfers PT goals addressed during session: Mobility/safety with mobility OT goals addressed during session: ADL's and self-care;Strengthening/ROM      End of Session   Activity Tolerance: Patient tolerated treatment well Patient left: in bed;with call bell/phone within reach;with SCD's reapplied     Time: 1007-1035 PT Time Calculation (min) (ACUTE ONLY): 28 min  Charges:  $Therapeutic Activity: 8-22 mins                    G Codes:      Kalista Laguardia A Avid Guillette 05/02/2016, 11:54 AM Wray Kearns, PT, DPT [phone]

## 2016-05-02 NOTE — Progress Notes (Signed)
Occupational Therapy Treatment Patient Details Name: [name] MRN: [id number] DOB: Aug 13, 1927 Today's Date: 05/02/2016    History of present illness 83 y.o. female with a history of hypertension, dementia, chronic kidney disease, previous stroke , peripheral vascular disease and urinary tract  Infections, transferred from [facility] after CT scan of the head was obtained which showed a large left intracerebral hemorrhage with ventricular extension and mild mass effect.   OT comments  Pt currently with limited participation. Pt supine to sit eob with RN present max (A) to complete feeding apple sauce medication. Pt requires small amount on lips to initiate bites and delayed response to swallow. Pt not following any verbal commands. Pt total (A) for all care at this time. No family present and recommendation for SNf at this time.    Follow Up Recommendations  SNF;Supervision/Assistance - 24 hour    Equipment Recommendations  Other (comment)    Recommendations for Other Services      Precautions / Restrictions Precautions Precautions: Fall       Mobility Bed Mobility Overal bed mobility: Needs Assistance Bed Mobility: Sidelying to Sit;Supine to Sit   Sidelying to sit: +2 for physical assistance;Total assist Supine to sit: Total assist;+2 for physical assistance Sit to supine: Total assist;+2 for physical assistance   General bed mobility comments: pt with full body tremor like motion with startle to movement. pt with L eye deviation and neck rotation  Transfers                 General transfer comment: not assessed due to limited ability to sustain EOB sitting without (A)     Balance Overall balance assessment: Needs assistance Sitting-balance support: No upper extremity supported;Feet unsupported Sitting balance-Leahy Scale: Poor Sitting balance - Comments: pt with posterior lean and tremors with anterior weight shift as if fearful of falling forward                            ADL Overall ADL's : Needs assistance/impaired                                       General ADL Comments: total (A) for all adls. pt incontinent of stool this session and lack of awareness      Vision                 Additional Comments: R inattention with L neck rotation noted   Perception     Praxis      Cognition   Behavior During Therapy: Flat affect Overall Cognitive Status: Difficult to assess                  General Comments: pt not following commands. pt with no righting response to LOB. pt with verbalizations only to startle and pain    Extremity/Trunk Assessment               Exercises     Shoulder Instructions       General Comments      Pertinent Vitals/ Pain       Pain Assessment: Faces Faces Pain Scale: Hurts little more Pain Location: grimace to sensory assessment Pain Descriptors / Indicators: Grimacing Pain Intervention(s): Monitored during session;Repositioned;Premedicated before session;RN gave pain meds during session  Home Living  Prior Functioning/Environment              Frequency  Min 2X/week        Progress Toward Goals  OT Goals(current goals can now be found in the care plan section)  Progress towards OT goals: Not progressing toward goals - comment  Acute Rehab OT Goals Patient Stated Goal: none stated OT Goal Formulation: Patient unable to participate in goal setting Time For Goal Achievement: 05/14/16 Potential to Achieve Goals: Good ADL Goals Pt Will Perform Grooming: with min guard assist;sitting Pt Will Transfer to Toilet: with mod assist;stand pivot transfer;bedside commode Additional ADL Goal #1: Pt will follow one step command 50% of the time in a minimally distracting environment. Additional ADL Goal #2: Pt will attend to objects in R visual field 50% of the time with mod multimodal cues.   Plan Discharge plan remains appropriate    Co-evaluation    PT/OT/SLP Co-Evaluation/Treatment: Yes Reason for Co-Treatment: Complexity of the patient's impairments (multi-system involvement);For patient/therapist safety;Necessary to address cognition/behavior during functional activity   OT goals addressed during session: ADL's and self-care;Strengthening/ROM      End of Session     Activity Tolerance Patient tolerated treatment well   Patient Left in bed;with call bell/phone within reach;with bed alarm set   Nurse Communication Mobility status;Precautions        Time: [id number] OT Time Calculation (min): 27 min  Charges: OT General Charges $OT Visit: 1 Procedure OT Treatments $Self Care/Home Management : 8-22 mins  Parke Poisson B 05/02/2016, 11:38 AM   Jeri Modena   OTR/L Pager: [phone] Office: [phone] .

## 2016-05-02 NOTE — Progress Notes (Signed)
Speech Language Pathology Treatment: Dysphagia  Patient Details Name: [name] MRN: [id number] DOB: 11/06/1927 Today's Date: 05/02/2016 Time: [id number] SLP Time Calculation (min) (ACUTE ONLY): 8 min  Assessment / Plan / Recommendation Clinical Impression  Pt is lethargic this afternoon and will arouse to stimulation but has difficulty maintaining alertness. RN says she is often more lethargic during the day, which has been impacting the amount of her intake. With Max cues for sustained attention, pt was able to consume one bite of puree with SLP. No oral acceptance of liquids observed despite attempts with straw, cup, and spoon. Pt had oral holding and anterior loss of pureed bolus, but she did ultimately initiate a swallow without overt signs of aspiration. Recommend to continue current diet when pt is maximally alert and accepting of POs. Will continue to follow.   HPI HPI: 83 y.o.femalewith history of hypertension, dementia, previous CVA, baseline speech abnormality, urinary tract infections, peripheral vascular disease, colon cancer, chronic kidney disease, and anemiapresenting with altered mental status and right hemiplegia.Shedidnot receive IV t-PA due to [place] (left parietal lobe). Patient with neglect and right sided weakness.       SLP Plan  Continue with current plan of care     Recommendations  Diet recommendations: Dysphagia 1 (puree);Thin liquid Liquids provided via: Cup;Straw Medication Administration: Crushed with puree Supervision: Staff to assist with self feeding;Full supervision/cueing for compensatory strategies Compensations: Slow rate;Small sips/bites Postural Changes and/or Swallow Maneuvers: Seated upright 90 degrees;Upright 30-60 min after meal                Oral Care Recommendations: Oral care BID Follow up Recommendations: Skilled Nursing facility Plan: Continue with current plan of care       GO                Germain Osgood 05/02/2016, 2:40 PM  Germain Osgood, M.A. CCC-SLP [phone]

## 2016-05-02 NOTE — Progress Notes (Signed)
STROKE TEAM PROGRESS NOTE   SUBJECTIVE (INTERVAL HISTORY) No family at bedside. Pt is sleepy but arousable this am. K still low. Will check Mg and give Mg supplement in addition to K supplement. Pt not eating well, will have dietitian consult and add ensure. Still on low dose cleviprex. Will increase BP meds.    OBJECTIVE Temp:  [97.3 F (36.3 C)-101.8 F (38.8 C)] 97.3 F (36.3 C) (01/03 0800) Pulse Rate:  [77-100] 89 (01/03 1321) Cardiac Rhythm: Normal sinus rhythm (01/03 0730) Resp:  [8-26] 19 (01/03 1321) BP: (96-184)/(53-100) 157/81 (01/03 1321) SpO2:  [89 %-100 %] 100 % (01/03 1321)  CBC:   Recent Labs Lab 05/01/16 0406 05/02/16 0405  WBC 12.6* 11.1*  HGB 11.5* 12.6  HCT 34.7* 37.4  MCV 85.7 85.6  PLT 177 0000000    Basic Metabolic Panel:   Recent Labs Lab 05/01/16 0406 05/02/16 0405  NA 138 138  K 2.8* 2.9*  CL 105 103  CO2 24 25  GLUCOSE 132* 122*  BUN 7 12  CREATININE 0.65 0.81  CALCIUM 8.0* 8.4*  MG  --  1.7   HgbA1c:  Lab Results  Component Value Date   HGBA1C 5.3 04/30/2016      IMAGING I have personally reviewed the radiological images below and agree with the radiology interpretations.  Ct Head Wo Contrast 04/28/2016   Acute intraparenchymal hemorrhage in the left parietal lobe with hematoma measuring 7.8 x 3.6 x 6.6 cm, 97 cc volume. Intraventricular penetration with a small amount of blood dependent in the lateral ventricle on the right. Mass effect with flattening of the posterior aspect of the left lateral ventricle and 3 mm of left-to-right shift.   04/29/2016 No increased bleeding or mass effect since yesterday. Some contraction of the hyperdense blood clot in the left parietal region. Interventricular communication without increased amount of intraventricular blood.  05/01/2016 IMPRESSION: Evolving LEFT intraparenchymal hematoma with intraventricular extension. 2 mm LEFT-to-RIGHT midline shift. Small amount of residual re- distributed  subarachnoid hemorrhage.   Ct Abdomen Pelvis W Contrast 04/27/2016 1. Moderate stool burden with rectal distention, concerning for fecal impaction. No bowel obstruction.  2. Small umbilical hernia contains noninflamed nonobstructed loop of small bowel and anti mesenteric border of transverse colon.  3. Gallstone without gallbladder inflammation.  4. An incidental finding of potential clinical significance has been found. Left adrenal nodule measures 2.4 cm. There are no prior exams for comparison. In the absence of malignancy history, recommend adrenal protocol CT.  5. **An incidental finding of potential clinical significance has been found. Ill-defined 2.2 cm hypodense lesion in the right lobe of the liver. Recommend nonemergent hepatic protocol MRI, preferably when patient is able tolerate breath hold technique.** multiple additional subcentimeter hepatic lesions are too small to characterize.  6. Atherosclerosis including coronary artery calcifications.  7. Multiple remote lumbar spine compression fractures.   Dg Chest Portable 1 View 04/27/2016 No acute cardiopulmonary process seen.   2D Echo  Normal LV size with mild focal basal septal hypertrophy. EF   60-65%. Normal RV size and systolic function. No significant   valvular abnormalities.   PHYSICAL EXAM  Temp:  [97.3 F (36.3 C)-101.8 F (38.8 C)] 97.3 F (36.3 C) (01/03 0800) Pulse Rate:  [77-100] 89 (01/03 1321) Resp:  [8-26] 19 (01/03 1321) BP: (96-184)/(53-100) 157/81 (01/03 1321) SpO2:  [89 %-100 %] 100 % (01/03 1321)  General - Well nourished, well developed, in no apparent distress.  Ophthalmologic - Fundi not visualized due to noncooperation.  Cardiovascular - Regular rate and rhythm.  Neuro - awake alert but global aphasia. Able to say "my lord" only with pain stimulation, not following commands. Right side neglect. Not blinking to visual threat on the right. PERRL, left gaze preference, not tracking to the  right. Right nasolabial fold mild flattening. Tongue in midline. RUE and RLE spontaneous movement and against gravity. Right UE and LE 1/5 on pain stimulation. No babinski, DTR 1+. Sensation, coordination and gait not tested.   ASSESSMENT/PLAN Ms. Keeyana Dial [place] is a 83 y.o. female with history of hypertension, dementia, previous CVA, baseline speech abnormality, urinary tract infections, peripheral vascular disease, colon cancer, chronic kidney disease, and anemia presenting with altered mental status and right hemiplegia.  She did not receive IV t-PA due to [place].  Stroke:  Acute L large parietal ICH with IVH, possibly hypertensive source vs CAA  Resultant  Neglect and right sided weakness  CT - Acute large ICH in the left parietal lobe with IVH.  repeat CT Head 04/29/16 - no increased hemotoma or IVH  Repeat CT head 05/01/16 - stable hematoma and IVH  Consider MRI and MRA once blood absorbed to rule out CAA or AVM or malignancy  Carotid Doppler - not indicated  2D Echo - EF 60-65%  LDL - 84  HgbA1c - 5.3  VTE prophylaxis - heparin subq DIET - DYS 1 Room service appropriate? Yes; Fluid consistency: Thin  aspirin 81 mg daily and clopidogrel 75 mg daily prior to admission, now on no antithrombotics due to [place]  Ongoing aggressive stroke risk factor management  Therapy recommendations: SNF  Disposition: Pending  Hypertension  BP 170s/90s in setting of neuro symptoms, hemorrhage  Treated with Cleviprex  Wean off cleviprex as able Goal SBP set at <160 On home BP meds with Clonidine, Atenolol, Plendil Add lisinopril Long-term BP goal normotensive  Fever, intermittent  UA WBC 6-30  Urine culture pending, NGTD  CXR negative  Blood culture NGTD  On Zosyn for empiric treatment  Hypokalemia  K 2.5-2.8-2.9  Continue K supplement  Check Mg  Give Mg supplement  Other Stroke Risk Factors  Advanced age  Hx stroke/TIA with residue speech difficulty, on DAPT at  home  Other Active Problems  Hx of colon cancer  Left Adrenal nodule - will require further workup - recommend adrenal protocol CT  Right Lobe Liver nodule - will require further workup - Recommend nonemergent hepatic protocol MRI  Hospital day # 4  This patient is critically ill due to large left ICH with IVH and at significant risk of neurological worsening, death form hematoma expansion, cerebral edema, brain herniation and hypertensive emergency. This patient's care requires constant monitoring of vital signs, hemodynamics, respiratory and cardiac monitoring, review of multiple databases, neurological assessment, discussion with family, other specialists and medical decision making of high complexity. I spent 40 minutes of neurocritical care time in the care of this patient.  Rosalin Hawking, MD PhD Stroke Neurology 05/02/2016 2:22 PM   To contact Stroke Continuity provider, please refer to [email or web address] After hours, contact General Neurology

## 2016-05-03 ENCOUNTER — Inpatient Hospital Stay (HOSPITAL_COMMUNITY): Payer: Medicare (Managed Care)

## 2016-05-03 LAB — BASIC METABOLIC PANEL
ANION GAP: 7 (ref 5–15)
BUN: 15 mg/dL (ref 6–20)
CALCIUM: 8.1 mg/dL — AB (ref 8.9–10.3)
CO2: 25 mmol/L (ref 22–32)
Chloride: 106 mmol/L (ref 101–111)
Creatinine, Ser: 0.74 mg/dL (ref 0.44–1.00)
GLUCOSE: 138 mg/dL — AB (ref 65–99)
Potassium: 3 mmol/L — ABNORMAL LOW (ref 3.5–5.1)
SODIUM: 138 mmol/L (ref 135–145)

## 2016-05-03 LAB — CBC
HCT: 32.2 % — ABNORMAL LOW (ref 36.0–46.0)
Hemoglobin: 10.6 g/dL — ABNORMAL LOW (ref 12.0–15.0)
MCH: 28.1 pg (ref 26.0–34.0)
MCHC: 32.9 g/dL (ref 30.0–36.0)
MCV: 85.4 fL (ref 78.0–100.0)
Platelets: 183 10*3/uL (ref 150–400)
RBC: 3.77 MIL/uL — ABNORMAL LOW (ref 3.87–5.11)
RDW: 12.8 % (ref 11.5–15.5)
WBC: 8.7 10*3/uL (ref 4.0–10.5)

## 2016-05-03 LAB — GLUCOSE, CAPILLARY
GLUCOSE-CAPILLARY: 137 mg/dL — AB (ref 65–99)
Glucose-Capillary: 130 mg/dL — ABNORMAL HIGH (ref 65–99)

## 2016-05-03 LAB — MAGNESIUM: MAGNESIUM: 1.8 mg/dL (ref 1.7–2.4)

## 2016-05-03 LAB — PHOSPHORUS: PHOSPHORUS: 2.9 mg/dL (ref 2.5–4.6)

## 2016-05-03 MED ORDER — MAGNESIUM SULFATE 2 GM/50ML IV SOLN
2.0000 g | Freq: Once | INTRAVENOUS | Status: AC
  Administered 2016-05-03: 2 g via INTRAVENOUS
  Filled 2016-05-03: qty 50

## 2016-05-03 MED ORDER — POTASSIUM CHLORIDE CRYS ER 20 MEQ PO TBCR
40.0000 meq | EXTENDED_RELEASE_TABLET | ORAL | Status: DC
  Administered 2016-05-03: 40 meq via ORAL
  Filled 2016-05-03: qty 2

## 2016-05-03 MED ORDER — HYDRALAZINE HCL 20 MG/ML IJ SOLN
10.0000 mg | INTRAMUSCULAR | Status: DC | PRN
  Administered 2016-05-03: 20 mg via INTRAVENOUS
  Filled 2016-05-03: qty 1

## 2016-05-03 MED ORDER — POTASSIUM CHLORIDE 20 MEQ/15ML (10%) PO SOLN
40.0000 meq | ORAL | Status: AC
  Administered 2016-05-03 (×2): 40 meq via ORAL
  Filled 2016-05-03 (×3): qty 30

## 2016-05-03 MED ORDER — CLONIDINE HCL 0.1 MG PO TABS
0.2000 mg | ORAL_TABLET | Freq: Three times a day (TID) | ORAL | Status: DC
  Administered 2016-05-03 – 2016-05-07 (×13): 0.2 mg via ORAL
  Filled 2016-05-03 (×13): qty 2

## 2016-05-03 MED ORDER — OSMOLITE 1.2 CAL PO LIQD
1000.0000 mL | ORAL | Status: DC
  Administered 2016-05-03: 1000 mL
  Filled 2016-05-03 (×3): qty 1000

## 2016-05-03 MED ORDER — LABETALOL HCL 5 MG/ML IV SOLN: 10.0000 mg | INTRAVENOUS | Status: DC | PRN

## 2016-05-03 MED ORDER — PRO-STAT SUGAR FREE PO LIQD
30.0000 mL | Freq: Every day | ORAL | Status: DC
  Administered 2016-05-03 – 2016-05-06 (×4): 30 mL
  Filled 2016-05-03 (×4): qty 30

## 2016-05-03 NOTE — Progress Notes (Signed)
Initial Nutrition Assessment  INTERVENTION:   Osmolite 1.2 @ 50 ml/hr (1200 ml/day) 30 ml Prostat daily Provides: 1540 kcal, 81 grams protein, and 984 ml H2O.   NUTRITION DIAGNOSIS:   Inadequate oral intake related to lethargy/confusion as evidenced by meal completion < 25%.  GOAL:   Patient will meet greater than or equal to 90% of their needs  MONITOR:   TF tolerance, PO intake, I & O's  REASON FOR ASSESSMENT:   Consult Enteral/tube feeding initiation and management  ASSESSMENT:   Pt with hx of HTN, dementia, CVA, baseline speech abnormality, PVD, CKD admitted with acute L large parietal ICH with IVH.    Pt discussed during ICU rounds and with RN.  PO intake is poor. This Probation officer placed a Cortrak tube, xray pending.  Meal Completion: 0-20%  Medications reviewed and include: colace, magnesium sulfate, K-dur, senokot-s Labs reviewed: K+ 3.0 Nutrition-Focused physical exam completed. Findings are no fat depletion, BLE mild/moderate muscle depletion, and no edema.  Unsure of functional status PTA.    Diet Order:  DIET - DYS 1 Room service appropriate? Yes; Fluid consistency: Thin  Skin:  Reviewed, no issues  Last BM:  1/4  Height:   Ht Readings from Last 1 Encounters:  04/28/16 5\' 4"  (1.626 m)    Weight:   Wt Readings from Last 1 Encounters:  04/28/16 125 lb (56.7 kg)    Ideal Body Weight:  54.5 kg  BMI:  Body mass index is 21.46 kg/m.  Estimated Nutritional Needs:   Kcal:  1400-1600  Protein:  70-80 grams  Fluid:  > 1.5 L/day  EDUCATION NEEDS:   No education needs identified at this time  [place], [place], [place] Pager [phone] After Hours Pager

## 2016-05-03 NOTE — Progress Notes (Signed)
Pt arrived to 5M11 via bed.  Pt sleepy but aroused when moved in bed.  Cortrak in place.  Will continue to monitor.  Cori Razor, RN

## 2016-05-03 NOTE — Progress Notes (Signed)
STROKE TEAM PROGRESS NOTE   SUBJECTIVE (INTERVAL HISTORY) No family at bedside. Pt continue to be sleepy but arousable this am. Still global aphasic. K still low, but slightly improved from yesterday. Will give Mg and K supplement. Pt not eating well, will put NG tbue. Still on low dose cleviprex. Will further increase BP meds. Still has low grade fever, on zosyn day 3   OBJECTIVE Temp:  [98.5 F (36.9 C)-100.9 F (38.3 C)] 100.9 F (38.3 C) (01/04 1200) Pulse Rate:  [74-90] 81 (01/04 1400) Cardiac Rhythm: Normal sinus rhythm (01/04 0800) Resp:  [13-27] 15 (01/04 1400) BP: (104-191)/(63-103) 180/82 (01/04 1400) SpO2:  [98 %-100 %] 100 % (01/04 1400)  CBC:   Recent Labs Lab 05/02/16 0405 05/03/16 0408  WBC 11.1* 8.7  HGB 12.6 10.6*  HCT 37.4 32.2*  MCV 85.6 85.4  PLT 246 [id number]    Basic Metabolic Panel:   Recent Labs Lab 05/02/16 0405 05/03/16 0408  NA 138 138  K 2.9* 3.0*  CL 103 106  CO2 25 25  GLUCOSE 122* 138*  BUN 12 15  CREATININE 0.81 0.74  CALCIUM 8.4* 8.1*  MG 1.7 1.8   HgbA1c:  Lab Results  Component Value Date   HGBA1C 5.3 04/30/2016      IMAGING I have personally reviewed the radiological images below and agree with the radiology interpretations.  Ct Head Wo Contrast 04/28/2016   Acute intraparenchymal hemorrhage in the left parietal lobe with hematoma measuring 7.8 x 3.6 x 6.6 cm, 97 cc volume. Intraventricular penetration with a small amount of blood dependent in the lateral ventricle on the right. Mass effect with flattening of the posterior aspect of the left lateral ventricle and 3 mm of left-to-right shift.   04/29/2016 No increased bleeding or mass effect since yesterday. Some contraction of the hyperdense blood clot in the left parietal region. Interventricular communication without increased amount of intraventricular blood.  05/01/2016 IMPRESSION: Evolving LEFT intraparenchymal hematoma with intraventricular extension. 2 mm  LEFT-to-RIGHT midline shift. Small amount of residual re- distributed subarachnoid hemorrhage.   Ct Abdomen Pelvis W Contrast 04/27/2016 1. Moderate stool burden with rectal distention, concerning for fecal impaction. No bowel obstruction.  2. Small umbilical hernia contains noninflamed nonobstructed loop of small bowel and anti mesenteric border of transverse colon.  3. Gallstone without gallbladder inflammation.  4. An incidental finding of potential clinical significance has been found. Left adrenal nodule measures 2.4 cm. There are no prior exams for comparison. In the absence of malignancy history, recommend adrenal protocol CT.  5. **An incidental finding of potential clinical significance has been found. Ill-defined 2.2 cm hypodense lesion in the right lobe of the liver. Recommend nonemergent hepatic protocol MRI, preferably when patient is able tolerate breath hold technique.** multiple additional subcentimeter hepatic lesions are too small to characterize.  6. Atherosclerosis including coronary artery calcifications.  7. Multiple remote lumbar spine compression fractures.   Dg Chest Portable 1 View 04/27/2016 No acute cardiopulmonary process seen.   2D Echo  Normal LV size with mild focal basal septal hypertrophy. EF   60-65%. Normal RV size and systolic function. No significant   valvular abnormalities.  MRI and MRA pending   PHYSICAL EXAM  Temp:  [98.5 F (36.9 C)-100.9 F (38.3 C)] 100.9 F (38.3 C) (01/04 1200) Pulse Rate:  [74-90] 81 (01/04 1400) Resp:  [13-27] 15 (01/04 1400) BP: (104-191)/(63-103) 180/82 (01/04 1400) SpO2:  [98 %-100 %] 100 % (01/04 1400)  General - Well nourished, well developed,  in no apparent distress.  Ophthalmologic - Fundi not visualized due to noncooperation.  Cardiovascular - Regular rate and rhythm.  Neuro - awake alert but global aphasia. Able to say "my lord" only with pain stimulation, not following commands. Right side neglect.  Not blinking to visual threat on the right. PERRL, left gaze preference, not tracking to the right. Right nasolabial fold mild flattening. Tongue in midline. RUE and RLE spontaneous movement and against gravity. Right UE and LE 1/5 on pain stimulation. No babinski, DTR 1+. Sensation, coordination and gait not tested.   ASSESSMENT/PLAN [name] is a 83 y.o. female with history of hypertension, dementia, previous CVA, baseline speech abnormality, urinary tract infections, peripheral vascular disease, colon cancer, chronic kidney disease, and anemia presenting with altered mental status and right hemiplegia.  She did not receive IV t-PA due to [place].  Stroke:  Acute L large parietal ICH with IVH, possibly hypertensive source vs CAA  Resultant  Neglect and right sided weakness  CT - Acute large ICH in the left parietal lobe with IVH.  repeat CT Head 04/29/16 - no increased hemotoma or IVH  Repeat CT head 05/01/16 - stable hematoma and IVH  MRI and MRA pending to rule out CAA or AVM or malignancy  Carotid Doppler - not indicated  2D Echo - EF 60-65%  LDL - 84  HgbA1c - 5.3  VTE prophylaxis - heparin subq DIET - DYS 1 Room service appropriate? Yes; Fluid consistency: Thin  aspirin 81 mg daily and clopidogrel 75 mg daily prior to admission, now on no antithrombotics due to [place]  Ongoing aggressive stroke risk factor management  Therapy recommendations: SNF  Disposition: Pending  Hypertension  BP 170s/90s in setting of neuro symptoms, hemorrhage  Treated with Cleviprex  Wean off cleviprex as able Goal SBP set at <160 On home BP meds with Clonidine, Atenolol, Plendil Add lisinopril, and increase clonidine Long-term BP goal normotensive  Fever, intermittent  UA WBC 6-30  Urine culture negative  CXR negative  Blood culture NGTD  On Zosyn for empiric treatment  Hypokalemia  K 2.5-2.8-2.9-3.0  Continue K supplement  Mg 1.7-1.8  Give Mg supplement  Other  Stroke Risk Factors  Advanced age  Hx stroke/TIA with residue speech difficulty, on DAPT at home  Other Active Problems  Hx of colon cancer  Left Adrenal nodule - will require further workup - recommend adrenal protocol CT  Right Lobe Liver nodule - will require further workup - Recommend nonemergent hepatic protocol MRI  Hospital day # 5  This patient is critically ill due to large left ICH with IVH and at significant risk of neurological worsening, death form hematoma expansion, cerebral edema, brain herniation and hypertensive emergency. This patient's care requires constant monitoring of vital signs, hemodynamics, respiratory and cardiac monitoring, review of multiple databases, neurological assessment, discussion with family, other specialists and medical decision making of high complexity. I spent 35 minutes of neurocritical care time in the care of this patient.  Rosalin Hawking, MD PhD Stroke Neurology 05/03/2016 2:57 PM   To contact Stroke Continuity provider, please refer to [email or web address] After hours, contact General Neurology

## 2016-05-03 NOTE — Care Management Note (Signed)
Case Management Note  Patient Details  Name: [name] MRN: [id number] Date of Birth: [birth year]  Subjective/Objective:   Pt admitted on 04/28/16 with large Lt [place] with ventricular extension and mild mass effect.  PTA, pt resides at home with daughter.                   Action/Plan: PT/OT recommending SNF for rehab; CSW consulted to facilitate dc to SNF upon medical stability.  Will follow progress.    Expected Discharge Date:                  Expected Discharge Plan:  Skilled Nursing Facility  In-House Referral:  Clinical Social Work  Discharge planning Services  CM Consult  Post Acute Care Choice:    Choice offered to:     DME Arranged:    DME Agency:     HH Arranged:    [place] Agency:     Status of Service:  In process, will continue to follow  If discussed at Long Length of Stay Meetings, dates discussed:    Additional Comments:  Reinaldo Raddle, RN, BSN  Trauma/Neuro ICU Case Manager [phone]

## 2016-05-04 ENCOUNTER — Encounter (HOSPITAL_COMMUNITY): Payer: Self-pay

## 2016-05-04 ENCOUNTER — Inpatient Hospital Stay (HOSPITAL_COMMUNITY): Payer: Medicare (Managed Care)

## 2016-05-04 LAB — MAGNESIUM
MAGNESIUM: 2.2 mg/dL (ref 1.7–2.4)
Magnesium: 2 mg/dL (ref 1.7–2.4)

## 2016-05-04 LAB — BASIC METABOLIC PANEL
Anion gap: 7 (ref 5–15)
BUN: 25 mg/dL — AB (ref 6–20)
CHLORIDE: 108 mmol/L (ref 101–111)
CO2: 24 mmol/L (ref 22–32)
CREATININE: 0.99 mg/dL (ref 0.44–1.00)
Calcium: 8 mg/dL — ABNORMAL LOW (ref 8.9–10.3)
GFR calc Af Amer: 58 mL/min — ABNORMAL LOW (ref >60)
GFR calc non Af Amer: 50 mL/min — ABNORMAL LOW (ref >60)
Glucose, Bld: 196 mg/dL — ABNORMAL HIGH (ref 65–99)
Potassium: 3.8 mmol/L (ref 3.5–5.1)
Sodium: 139 mmol/L (ref 135–145)

## 2016-05-04 LAB — CBC
HEMATOCRIT: 28.3 % — AB (ref 36.0–46.0)
HEMOGLOBIN: 9.3 g/dL — AB (ref 12.0–15.0)
MCH: 28.5 pg (ref 26.0–34.0)
MCHC: 32.9 g/dL (ref 30.0–36.0)
MCV: 86.8 fL (ref 78.0–100.0)
Platelets: 173 10*3/uL (ref 150–400)
RBC: 3.26 MIL/uL — ABNORMAL LOW (ref 3.87–5.11)
RDW: 13.3 % (ref 11.5–15.5)
WBC: 7.9 10*3/uL (ref 4.0–10.5)

## 2016-05-04 LAB — GLUCOSE, CAPILLARY
GLUCOSE-CAPILLARY: 208 mg/dL — AB (ref 65–99)
Glucose-Capillary: 180 mg/dL — ABNORMAL HIGH (ref 65–99)
Glucose-Capillary: 164 mg/dL — ABNORMAL HIGH (ref 65–99)
Glucose-Capillary: 141 mg/dL — ABNORMAL HIGH (ref 65–99)

## 2016-05-04 LAB — PHOSPHORUS
PHOSPHORUS: 2.9 mg/dL (ref 2.5–4.6)
Phosphorus: 2.5 mg/dL (ref 2.5–4.6)

## 2016-05-04 NOTE — Care Management Important Message (Signed)
Important Message  Patient Details  Name: [name] MRN: [id number] Date of Birth: 1927/08/17   Medicare Important Message Given:  Yes    Orbie Pyo 05/04/2016, 12:56 PM

## 2016-05-04 NOTE — Progress Notes (Signed)
Nutrition Follow-up  INTERVENTION:  Hold TF for 24 hours. If pt is able to eat and/or drink supplement at every meal, recommend discontinuing feeding tube.   Ensure Enlive po TID after meals, each supplement provides 350 kcal and 20 grams of protein    NUTRITION DIAGNOSIS:   Inadequate oral intake related to lethargy/confusion as evidenced by meal completion < 25%.  Progressing  GOAL:   Patient will meet greater than or equal to 90% of their needs  Being Met  MONITOR:   TF tolerance, PO intake, I & O's  REASON FOR ASSESSMENT:   Consult Enteral/tube feeding initiation and management  ASSESSMENT:   Pt with hx of HTN, dementia, CVA, baseline speech abnormality, PVD, CKD admitted with acute L large parietal ICH with IVH.   RD consulted by RN regarding tube feeding. RN reports that patient is more awake and ate most of breakfast. RD met with patient's two daughters at bedside. They were attempting to feed patient lunch. Pt receiving Osmolite 1.2 @ goal rate of 50 ml/hr at time of visit. Daughter's are agreeable to holding TF for 24 hours to assess patient's PO intake. Plan discussed with RN. Per nursing notes, pt ended up eating 25% of lunch.   Labs: low hemoglobin, elevated glucose, low calcium, elevated glucose  Diet Order:  DIET - DYS 1 Room service appropriate? Yes; Fluid consistency: Thin  Skin:  Reviewed, no issues  Last BM:  1/4  Height:   Ht Readings from Last 1 Encounters:  04/28/16 _0  (1.626 m)    Weight:   Wt Readings from Last 1 Encounters:  05/04/16 137 lb (62.1 kg)    Ideal Body Weight:  54.5 kg  BMI:  Body mass index is 23.52 kg/m.  Estimated Nutritional Needs:   Kcal:  1400-1600  Protein:  70-80 grams  Fluid:  > 1.5 L/day  EDUCATION NEEDS:   No education needs identified at this time  [place], CSP, LDN Inpatient Clinical Dietitian Pager: [phone] After Hours Pager: [phone]

## 2016-05-04 NOTE — Progress Notes (Signed)
On call MD made aware of increase change in NIH, CT ordered and results given to MD.  No new orders given at this time. Continue to monitor at this time.

## 2016-05-04 NOTE — Progress Notes (Signed)
STROKE TEAM PROGRESS NOTE   SUBJECTIVE (INTERVAL HISTORY) Two daughters are at bedside. I informed them about pt current condition, diagnosis, treatment options and prognosis. I also showed images to them. They would like to discuss about SNF vs. Palliative care.   Later on, I discussed with Dr. Coralie Common who had talk with daughters that Dr. Coralie Common would like pt to go to SNF for 2-3 days before transition to home [facility] program.    OBJECTIVE Temp:  [98.6 F (37 C)-100.9 F (38.3 C)] 98.6 F (37 C) (01/05 1043) Pulse Rate:  [81-105] 85 (01/05 1043) Cardiac Rhythm: Normal sinus rhythm (01/05 0700) Resp:  [14-22] 16 (01/05 1043) BP: (132-177)/(58-93) 146/63 (01/05 1043) SpO2:  [97 %-100 %] 100 % (01/05 1043) Weight:  [62.1 kg (137 lb)] 62.1 kg (137 lb) (01/05 0449)  CBC:   Recent Labs Lab 05/03/16 0408 05/04/16 0622  WBC 8.7 7.9  HGB 10.6* 9.3*  HCT 32.2* 28.3*  MCV 85.4 86.8  PLT 183 [id number]    Basic Metabolic Panel:   Recent Labs Lab 05/03/16 0408 05/03/16 1600 05/04/16 0540 05/04/16 0622  NA 138  --   --  139  K 3.0*  --   --  3.8  CL 106  --   --  108  CO2 25  --   --  24  GLUCOSE 138*  --   --  196*  BUN 15  --   --  25*  CREATININE 0.74  --   --  0.99  CALCIUM 8.1*  --   --  8.0*  MG 1.8  --  2.2  --   PHOS  --  2.9 2.5  --    HgbA1c:  Lab Results  Component Value Date   HGBA1C 5.3 04/30/2016      IMAGING I have personally reviewed the radiological images below and agree with the radiology interpretations.  Mr Jodene Nam Head Wo Contrast 05/03/2016 1. Extremely limited study due to extensive motion artifact. 2. No AVM or other vascular abnormality seen underlying the left temporoparietal hematoma. 3. Question focal ectasia versus aneurysm at the cavernous right ICA as above. This could be further assessed with follow-up repeat study if the patient is able to hold still. Alternatively, CTA could also be performed. 4. No obvious large vessel occlusion or high-grade  stenosis identified. Vertebrobasilar system is suspected to be diminutive, and is extremely poorly evaluated on this exam.   Mr Brain Wo Contrast 05/03/2016 1. Limited study due to motion artifact and lack of IV contrast. 2. Similar size and appearance of large left temporoparietal intraparenchymal hematoma, measuring 68 x 35 x 61 mm on today's study. Similar associated mass effect and edema. Associated intraventricular extension with small volume intraventricular hemorrhage. Stable ventricular size without hydrocephalus. Subarachnoid hemorrhage likely related to redistribution. No obvious underlying mass lesion identified on this motion degraded noncontrast examination. No definite findings to suggest cerebral amyloid angiography identified. 3. Associated subdural extension with small left temporal parietal subdural hematoma present measuring up to 3 mm. 4. Trace 3 mm left-to-right midline shift.   Ct Head Wo Contrast  05/04/2016 Evolving LEFT cerebral hematoma with resolving intraventricular extension. No hydrocephalus. Stable 2 mm LEFT-to-RIGHT midline shift. Small amount of residual re- distributed subarachnoid hemorrhage.   05/01/2016 Evolving LEFT intraparenchymal hematoma with intraventricular extension. 2 mm LEFT-to-RIGHT midline shift. Small amount of residual re- distributed subarachnoid hemorrhage.   04/29/2016 No increased bleeding or mass effect since yesterday. Some contraction of the hyperdense blood clot in  the left parietal region. Interventricular communication without increased amount of intraventricular blood.  04/28/2016   Acute intraparenchymal hemorrhage in the left parietal lobe with hematoma measuring 7.8 x 3.6 x 6.6 cm, 97 cc volume. Intraventricular penetration with a small amount of blood dependent in the lateral ventricle on the right. Mass effect with flattening of the posterior aspect of the left lateral ventricle and 3 mm of left-to-right shift.   2D Echo  Normal LV  size with mild focal basal septal hypertrophy. EF 60-65%. Normal RV size and systolic function. No significant valvular abnormalities.  Ct Abdomen Pelvis W Contrast 04/27/2016 1. Moderate stool burden with rectal distention, concerning for fecal impaction. No bowel obstruction.  2. Small umbilical hernia contains noninflamed nonobstructed loop of small bowel and anti mesenteric border of transverse colon.  3. Gallstone without gallbladder inflammation.  4. An incidental finding of potential clinical significance has been found. Left adrenal nodule measures 2.4 cm. There are no prior exams for comparison. In the absence of malignancy history, recommend adrenal protocol CT.  5. **An incidental finding of potential clinical significance has been found. Ill-defined 2.2 cm hypodense lesion in the right lobe of the liver. Recommend nonemergent hepatic protocol MRI, preferably when patient is able tolerate breath hold technique.** multiple additional subcentimeter hepatic lesions are too small to characterize.  6. Atherosclerosis including coronary artery calcifications.  7. Multiple remote lumbar spine compression fractures.   Dg Chest Portable 1 View 04/27/2016 No acute cardiopulmonary process seen.    PHYSICAL EXAM  Temp:  [98.6 F (37 C)-100.9 F (38.3 C)] 98.6 F (37 C) (01/05 1043) Pulse Rate:  [81-105] 85 (01/05 1043) Resp:  [14-22] 16 (01/05 1043) BP: (132-177)/(58-93) 146/63 (01/05 1043) SpO2:  [97 %-100 %] 100 % (01/05 1043) Weight:  [62.1 kg (137 lb)] 62.1 kg (137 lb) (01/05 0449)  General - Well nourished, well developed, in no apparent distress.  Ophthalmologic - Fundi not visualized due to noncooperation.  Cardiovascular - Regular rate and rhythm.  Neuro - awake alert but global aphasia. Able to say "my lord" only with pain stimulation, not following commands. Right side neglect. Not blinking to visual threat on the right. PERRL, left gaze preference, not tracking to the  right. Right nasolabial fold mild flattening. Tongue in midline. RUE and RLE spontaneous movement and against gravity. Right UE and LE 1/5 on pain stimulation. No babinski, DTR 1+. Sensation, coordination and gait not tested.   ASSESSMENT/PLAN Ms. Aritha Mostyn Alpert is a 83 y.o. female with history of hypertension, dementia, previous CVA, baseline speech abnormality, urinary tract infections, peripheral vascular disease, colon cancer, chronic kidney disease, and anemia presenting with altered mental status and right hemiplegia.  She did not receive IV t-PA due to [place].  Stroke:  Acute L large parietal ICH with IVH, possibly hypertensive source vs CAA  Resultant  Neglect and right sided weakness - pt has baseline nonverbal  CT - Acute large ICH in the left parietal lobe with IVH.  repeat CT Head 04/29/16 - no increased hemotoma or IVH  Repeat CT head 05/01/16 - stable hematoma and IVH  Repeat CT head 05/04/16 - remains stable, resolving IVH, resdistributed SAH  MRI large L IPH w/ similar mass effect, edema, IVH, SAH, trace 33mm L to R shift  MRA no obvious large or medium size vessel abnormality  Carotid Doppler - not indicated  2D Echo - EF 60-65%  LDL - 84  HgbA1c - 5.3  VTE prophylaxis - heparin subq DIET -  DYS 1 Room service appropriate? Yes; Fluid consistency: Thin - plan to hold TF x 24h and d/c if intake ok after 24h  aspirin 81 mg daily and clopidogrel 75 mg daily prior to admission, now on no antithrombotics due to [place]  Ongoing aggressive stroke risk factor management  Therapy recommendations: SNF  Disposition: Pending (involved with [facility]ACE program in the past. spoke with Dr. Coralie Common of [facility]. Patient's condition is too low-level to be considered for PACE program at discharge. Dr. Coralie Common would like pt to be discharged to [facility] Monday morning and then transition to home [facility]ACE program after 2-3 days)  Hypertension  BP 170s/90s in setting of neuro symptoms, hemorrhage  Treated with  Cleviprex, now off Goal SBP set at <180 On home BP meds with Clonidine, Atenolol, Plendil Added lisinopril, and increase clonidine Long-term BP goal normotensive  Fever, intermittent  UA WBC 6-30  Urine culture negative  CXR negative  Blood culture NGTD  On Zosyn for empiric treatment  Hypokalemia  K 2.5-2.8-2.9-3.0-3.8  Continue K supplement  Mg 1.7-1.8  Give Mg supplement  Other Stroke Risk Factors  Advanced age  Hx stroke/TIA with residue speech difficulty, on DAPT at home  Other Active Problems  Hx of colon cancer  Baseline dementia and nonverbal   Left Adrenal nodule - hold off further workup as pt DNR and poor baseline condition  Right Lobe Liver nodule - hold off further workup as pt DNR and poor baseline condition  DNR status now  Hospital day # 6  Rosalin Hawking, MD PhD Stroke Neurology 05/04/2016 10:10 PM   To contact Stroke Continuity provider, please refer to [email or web address] After hours, contact General Neurology

## 2016-05-04 NOTE — Plan of Care (Signed)
Had phone call with Dr. Coralie Common over the phone. She stated that pt daughter told her that she does not want her mom to have CPR or intubation if in any situation pt heart stops or not able to breath. Currently pt in full code, I will change to DNR code status.  Dr. Coralie Common also stated that pt likely needs to go to SNF for 2-3 days next week before transition to home with PACE program. She would like pt to be discharged to [facility] Monday morning. I will discuss with CM to try to arrange.   Rosalin Hawking, MD PhD Stroke Neurology 05/04/2016 4:44 PM

## 2016-05-04 NOTE — Progress Notes (Signed)
CM spoke to Jenny Reichmann [phone]) with the [facility] program in [place]. They are assisting Ms Tony family with discharge plans. Per Jenny Reichmann, [facility] has a contract with [facility] and the patient can go there for rehab until ready to return home. They at that point can provide equipment and extra care in the home to assist the daughter. John asked that the patients  information be sent to [facility] to assist with the transition once the patient is ready for d/c. CSW informed. CM continuing to follow.

## 2016-05-05 LAB — GLUCOSE, CAPILLARY
GLUCOSE-CAPILLARY: 124 mg/dL — AB (ref 65–99)
GLUCOSE-CAPILLARY: 176 mg/dL — AB (ref 65–99)
Glucose-Capillary: 130 mg/dL — ABNORMAL HIGH (ref 65–99)
Glucose-Capillary: 142 mg/dL — ABNORMAL HIGH (ref 65–99)

## 2016-05-05 LAB — CULTURE, BLOOD (ROUTINE X 2)
CULTURE: NO GROWTH
Culture: NO GROWTH

## 2016-05-05 LAB — MAGNESIUM
Magnesium: 1.9 mg/dL (ref 1.7–2.4)
Magnesium: 1.8 mg/dL (ref 1.7–2.4)

## 2016-05-05 LAB — PHOSPHORUS: Phosphorus: 3.5 mg/dL (ref 2.5–4.6)

## 2016-05-05 NOTE — Clinical Social Work Placement (Signed)
[place] MUST PASARR will need to be confirmed with [facility] MUST Monday, 1/8 morning.   CLINICAL SOCIAL WORK PLACEMENT  NOTE  Date:  05/05/2016  Patient Details  Name: JYNESSA BASTIEN MRN: [id number] Date of Birth: [birth year]  Clinical Social Work is seeking post-discharge placement for this patient at the [place] level of care (*CSW will initial, date and re-position this form in  chart as items are completed):  Yes   Patient/family provided with [facility] Work Department's list of facilities offering this level of care within the geographic area requested by the patient (or if unable, by the patient's family).  Yes   Patient/family informed of their freedom to choose among providers that offer the needed level of care, that participate in Medicare, Medicaid or managed care program needed by the patient, have an available bed and are willing to accept the patient.  Yes   Patient/family informed of [facility]'s ownership interest in [facility] and [facility], as well as of the fact that they are under no obligation to receive care at these facilities.  PASRR submitted to EDS on       PASRR number received on       Existing PASRR number confirmed on       FL2 transmitted to all facilities in geographic area requested by pt/family on 05/05/16     FL2 transmitted to all facilities within larger geographic area on       Patient informed that his/her managed care company has contracts with or will negotiate with certain facilities, including the following:            Patient/family informed of bed offers received.  Patient chooses bed at       Physician recommends and patient chooses bed at      Patient to be transferred to   on  .  Patient to be transferred to facility by       Patient family notified on   of transfer.  Name of family member notified:        PHYSICIAN Please sign FL2, Please prepare priority discharge summary, including  medications, Please sign DNR     Additional Comment:    _______________________________________________ [place]on Axe A 05/05/2016, 11:32 AM

## 2016-05-05 NOTE — NC FL2 (Signed)
[place] MEDICAID FL2 LEVEL OF CARE SCREENING TOOL     IDENTIFICATION  Patient Name: [name] January Birthdate: [birth year] Sex: female Admission Date (Current Location): 04/28/2016  [facility] and [place] Number:  Herbalist and Address:  [facility] [place], [place] [address] [place], [place] [id number]      Provider Number: [id number]  Attending Physician Name and Address:  Rosalin Hawking, MD  Relative Name and Phone Number:       Current Level of Care: Hospital Recommended Level of Care: [place] Prior Approval Number:    Date Approved/Denied:   PASRR Number:    Discharge Plan: SNF    Current Diagnoses: Patient Active Problem List   Diagnosis Date Noted  . Hypokalemia   . Malignant hypertension   . Acute encephalopathy 04/28/2016  . Urinary tract infection without hematuria 04/28/2016  . Alzheimer's dementia 04/28/2016  . HTN (hypertension) 04/28/2016  . PVD (peripheral vascular disease) ([place]) 04/28/2016  . ICH (intracerebral hemorrhage) ([place]) 04/28/2016    Orientation RESPIRATION BLADDER Height & Weight     Self  Normal Incontinent Weight: 137 lb (62.1 kg) Height:  5\' 4"  (162.6 cm)  BEHAVIORAL SYMPTOMS/MOOD NEUROLOGICAL BOWEL NUTRITION STATUS   (none )  (none ) Continent Diet (DYS 1)  AMBULATORY STATUS COMMUNICATION OF NEEDS Skin   Extensive Assist Verbally Normal                       Personal Care Assistance Level of Assistance  Bathing, Feeding, Dressing Bathing Assistance: Maximum assistance Feeding assistance: Limited assistance Dressing Assistance: Maximum assistance     Functional Limitations Info  Speech, Hearing, Sight Sight Info: Adequate Hearing Info: Adequate Speech Info: Adequate    SPECIAL CARE FACTORS FREQUENCY  PT (By licensed PT), OT (By licensed OT)     PT Frequency: 2 OT Frequency: 2            Contractures      Additional Factors Info  Code Status, Allergies Code Status  Info: DNR  Allergies Info: N/A           Current Medications (05/05/2016):  This is the current hospital active medication list Current Facility-Administered Medications  Medication Dose Route Frequency Provider Last Rate Last Dose  . 0.9 %  sodium chloride infusion   Intravenous Continuous Rosalin Hawking, MD 50 mL/hr at 05/02/16 2359    . acetaminophen (TYLENOL) tablet 650 mg  650 mg Oral Q4H PRN Wallie Char   650 mg at 05/01/16 2005   Or  . acetaminophen (TYLENOL) solution 650 mg  650 mg Per Tube Q4H PRN Wallie Char   650 mg at 05/04/16 2207   Or  . acetaminophen (TYLENOL) suppository 650 mg  650 mg Rectal Q4H PRN Wallie Char   650 mg at 05/02/16 1309  . atenolol (TENORMIN) tablet 50 mg  50 mg Oral Daily Rosalin Hawking, MD   50 mg at 05/05/16 0933  . cloNIDine (CATAPRES) tablet 0.2 mg  0.2 mg Oral TID Rosalin Hawking, MD   0.2 mg at 05/05/16 0933  . docusate (COLACE) 50 MG/5ML liquid 100 mg  100 mg Oral BID Catha Gosselin, MD   100 mg at 05/05/16 0920  . feeding supplement (ENSURE ENLIVE) (ENSURE ENLIVE) liquid 237 mL  237 mL Oral TID BM Rosalin Hawking, MD   237 mL at 05/04/16 2206  . feeding supplement (PRO-STAT SUGAR FREE 64) liquid 30 mL  30 mL Per Tube  Daily Rosalin Hawking, MD   30 mL at 05/05/16 0933  . felodipine (PLENDIL) 24 hr tablet 10 mg  10 mg Oral Daily Catha Gosselin, MD   10 mg at 05/05/16 0920  . heparin injection 5,000 Units  5,000 Units Subcutaneous Q12H Rosalin Hawking, MD   5,000 Units at 05/05/16 0700  . hydrALAZINE (APRESOLINE) injection 10-20 mg  10-20 mg Intravenous Q2H PRN Rosalin Hawking, MD   20 mg at 05/03/16 1453  . labetalol (NORMODYNE,TRANDATE) injection 10-40 mg  10-40 mg Intravenous Q10 min PRN Rosalin Hawking, MD      . lisinopril (PRINIVIL,ZESTRIL) tablet 20 mg  20 mg Oral BID Rosalin Hawking, MD   20 mg at 05/05/16 0933  . pantoprazole (PROTONIX) EC tablet 40 mg  40 mg Oral Daily Rosalin Hawking, MD   40 mg at 05/05/16 0933  . piperacillin-tazobactam (ZOSYN) IVPB 3.375 g  3.375 g  Intravenous Q8H Rebecka Apley, RPH   3.375 g at 05/05/16 [id number]  . polyethylene glycol (MIRALAX / GLYCOLAX) packet 17 g  17 g Oral Daily PRN Catha Gosselin, MD      . senna-docusate (Senokot-S) tablet 1 tablet  1 tablet Oral BID Wallie Char   1 tablet at 05/05/16 [id number]     Discharge Medications: Please see discharge summary for a list of discharge medications.  Relevant Imaging Results:  Relevant Lab Results:   Additional Information SSN [id number]  Glendon Axe A

## 2016-05-05 NOTE — Clinical Social Work Note (Signed)
Clinical Social Work Assessment  Patient Details  Name: [name] MRN: [id number] Date of Birth: Sep 27, 1927  Date of referral:  05/05/16               Reason for consult:  Facility Placement, Discharge Planning                Permission sought to share information with:  Family Supports, Customer service manager, Case Optician, dispensing granted to share information::  Yes, Verbal Permission Granted  Name::      (Amy Gwynn)  Agency::   ([facility])  Relationship::   (Daughter )  Sport and exercise psychologist Information:   [phone])  Housing/Transportation Living arrangements for the past 2 months:  [place] of Information:  Adult Children Patient Interpreter Needed:  None Criminal Activity/Legal Involvement Pertinent to Current Situation/Hospitalization:  No - Comment as needed Significant Relationships:  Adult Children Lives with:  Adult Children Do you feel safe going back to the place where you live?  No Need for family participation in patient care:  Yes (Comment)  Care giving concerns:  Patient admitted from home with family and now requiring STR at skilled level.   Social Worker assessment / plan: MSW spoke with patient's dtr, Amy in reference to post-acute placement for SNF. MSW introduced MSW role and SNF process. Patient's dtr confirmed that patient is service connected with [facility] and that the plan is to transition to [facility]. Patient's dtr encouraged to ask questions, however does not have any questions at this time. No further concerns reported at this time by patient's dtr. MSW will continue to follow patient and pt's family for continued support and to facilitate discharge once medically stable.   Employment status:  Retired Forensic scientist:  Medicare PT Recommendations:  [place] / Referral to community resources:  [place]  Patient/Family's Response to care: Patient disoriented. Patient's family  agreeable to SNF placement at [facility]. Patient's dtr supportive, involved in care and appreciated social work intervention.   Patient/Family's Understanding of and Emotional Response to Diagnosis, Current Treatment, and Prognosis:  Patient's dtr knowledgeable of medical interventions and aware of possible discharge for Monday, 1/8.   Emotional Assessment Appearance:  Appears stated age Attitude/Demeanor/Rapport:  Unable to Assess Affect (typically observed):  Unable to Assess Orientation:  Oriented to Self Alcohol / Substance use:  Not Applicable Psych involvement (Current and /or in the community):  No (Comment)  Discharge Needs  Concerns to be addressed:  Denies Needs/Concerns at this time Readmission within the last 30 days:  No Current discharge risk:  Dependent with Mobility, Cognitively Impaired Barriers to Discharge:  Continued Medical Work up   Glendon Axe A 05/05/2016, 11:20 AM

## 2016-05-05 NOTE — Progress Notes (Signed)
STROKE TEAM PROGRESS NOTE   SUBJECTIVE (INTERVAL HISTORY) No family at bedside today. pt to go to SNF for 2-3 days before transition to home [facility] program.    OBJECTIVE Temp:  [98.6 F (37 C)-101.1 F (38.4 C)] 99.1 F (37.3 C) (01/06 0447) Pulse Rate:  [81-91] 81 (01/06 0447) Cardiac Rhythm: Normal sinus rhythm (01/05 2058) Resp:  [16-18] 18 (01/06 0447) BP: (145-197)/(63-99) 179/87 (01/06 0447) SpO2:  [97 %-100 %] 99 % (01/06 0447)  CBC:   Recent Labs Lab 05/03/16 0408 05/04/16 0622  WBC 8.7 7.9  HGB 10.6* 9.3*  HCT 32.2* 28.3*  MCV 85.4 86.8  PLT 183 [id number]    Basic Metabolic Panel:   Recent Labs Lab 05/03/16 0408  05/04/16 0540 05/04/16 0622 05/04/16 1651  NA 138  --   --  139  --   K 3.0*  --   --  3.8  --   CL 106  --   --  108  --   CO2 25  --   --  24  --   GLUCOSE 138*  --   --  196*  --   BUN 15  --   --  25*  --   CREATININE 0.74  --   --  0.99  --   CALCIUM 8.1*  --   --  8.0*  --   MG 1.8  --  2.2  --  2.0  PHOS  --   < > 2.5  --  2.9  < > = values in this interval not displayed. HgbA1c:  Lab Results  Component Value Date   HGBA1C 5.3 04/30/2016      IMAGING I have personally reviewed the radiological images below and agree with the radiology interpretations.  Mr [name] Head Wo Contrast 05/03/2016 1. Extremely limited study due to extensive motion artifact. 2. No AVM or other vascular abnormality seen underlying the left temporoparietal hematoma. 3. Question focal ectasia versus aneurysm at the cavernous right ICA as above. This could be further assessed with follow-up repeat study if the patient is able to hold still. Alternatively, CTA could also be performed. 4. No obvious large vessel occlusion or high-grade stenosis identified. Vertebrobasilar system is suspected to be diminutive, and is extremely poorly evaluated on this exam.   Mr Brain Wo Contrast 05/03/2016 1. Limited study due to motion artifact and lack of IV contrast. 2. Similar size and  appearance of large left temporoparietal intraparenchymal hematoma, measuring 68 x 35 x 61 mm on today's study. Similar associated mass effect and edema. Associated intraventricular extension with small volume intraventricular hemorrhage. Stable ventricular size without hydrocephalus. Subarachnoid hemorrhage likely related to redistribution. No obvious underlying mass lesion identified on this motion degraded noncontrast examination. No definite findings to suggest cerebral amyloid angiography identified. 3. Associated subdural extension with small left temporal parietal subdural hematoma present measuring up to 3 mm. 4. Trace 3 mm left-to-right midline shift.   Ct Head Wo Contrast  05/04/2016 Evolving LEFT cerebral hematoma with resolving intraventricular extension. No hydrocephalus. Stable 2 mm LEFT-to-RIGHT midline shift. Small amount of residual re- distributed subarachnoid hemorrhage.   05/01/2016 Evolving LEFT intraparenchymal hematoma with intraventricular extension. 2 mm LEFT-to-RIGHT midline shift. Small amount of residual re- distributed subarachnoid hemorrhage.   04/29/2016 No increased bleeding or mass effect since yesterday. Some contraction of the hyperdense blood clot in the left parietal region. Interventricular communication without increased amount of intraventricular blood.  04/28/2016   Acute intraparenchymal hemorrhage in the left  parietal lobe with hematoma measuring 7.8 x 3.6 x 6.6 cm, 97 cc volume. Intraventricular penetration with a small amount of blood dependent in the lateral ventricle on the right. Mass effect with flattening of the posterior aspect of the left lateral ventricle and 3 mm of left-to-right shift.   2D Echo  Normal LV size with mild focal basal septal hypertrophy. EF 60-65%. Normal RV size and systolic function. No significant valvular abnormalities.  Ct Abdomen Pelvis W Contrast 04/27/2016 1. Moderate stool burden with rectal distention, concerning for  fecal impaction. No bowel obstruction.  2. Small umbilical hernia contains noninflamed nonobstructed loop of small bowel and anti mesenteric border of transverse colon.  3. Gallstone without gallbladder inflammation.  4. An incidental finding of potential clinical significance has been found. Left adrenal nodule measures 2.4 cm. There are no prior exams for comparison. In the absence of malignancy history, recommend adrenal protocol CT.  5. **An incidental finding of potential clinical significance has been found. Ill-defined 2.2 cm hypodense lesion in the right lobe of the liver. Recommend nonemergent hepatic protocol MRI, preferably when patient is able tolerate breath hold technique.** multiple additional subcentimeter hepatic lesions are too small to characterize.  6. Atherosclerosis including coronary artery calcifications.  7. Multiple remote lumbar spine compression fractures.   Dg Chest Portable 1 View 04/27/2016 No acute cardiopulmonary process seen.    PHYSICAL EXAM  Temp:  [98.6 F (37 C)-101.1 F (38.4 C)] 99.1 F (37.3 C) (01/06 0447) Pulse Rate:  [81-91] 81 (01/06 0447) Resp:  [16-18] 18 (01/06 0447) BP: (145-197)/(63-99) 179/87 (01/06 0447) SpO2:  [97 %-100 %] 99 % (01/06 0447)   Exam stable today, no changes:   General - Well nourished, well developed, in no apparent distress.  Ophthalmologic - Fundi not visualized due to noncooperation.  Cardiovascular - Regular rate and rhythm.  Neuro - Sleepy but arousable with sternal rub, global aphasia. Not following commands. Right side neglect. Not blinking to visual threat on the right. PERRL, left gaze preference, not tracking to the right. Right nasolabial fold mild flattening. Tongue in midline. RUE and RLE spontaneous movement and against gravity. Right UE and LE 1/5 on pain stimulation. No babinski, DTR 1+. Sensation, coordination and gait not tested.   ASSESSMENT/PLAN [name] is a 83 y.o. female with  history of hypertension, dementia, previous CVA, baseline speech abnormality, urinary tract infections, peripheral vascular disease, colon cancer, chronic kidney disease, and anemia presenting with altered mental status and right hemiplegia.  She did not receive IV t-PA due to [place].  Stroke:  Acute L large parietal ICH with IVH, possibly hypertensive source vs CAA  Resultant  Neglect and right sided weakness - pt has baseline nonverbal  CT - Acute large ICH in the left parietal lobe with IVH.  repeat CT Head 04/29/16 - no increased hemotoma or IVH  Repeat CT head 05/01/16 - stable hematoma and IVH  Repeat CT head 05/04/16 - remains stable, resolving IVH, resdistributed SAH  MRI large L IPH w/ similar mass effect, edema, IVH, SAH, trace 10mm L to R shift  MRA no obvious large or medium size vessel abnormality  Carotid Doppler - not indicated  2D Echo - EF 60-65%  LDL - 84  HgbA1c - 5.3  VTE prophylaxis - heparin subq DIET - DYS 1 Room service appropriate? Yes; Fluid consistency: Thin - plan to hold TF x 24h and d/c if intake ok after 24h  aspirin 81 mg daily and clopidogrel 75 mg  daily prior to admission, now on no antithrombotics due to [place]  Ongoing aggressive stroke risk factor management  Therapy recommendations: SNF  Disposition: Pending (involved with [facility] program in the past. spoke with Dr. Coralie Common of [facility]. Patient's condition is too low-level to be considered for [facility] program at discharge. Dr. Coralie Common would like pt to be discharged to [facility] Monday morning and then transition to home [facility] program after 2-3 days)  Hypertension  BP 170s/90s in setting of neuro symptoms, hemorrhage  Treated with Cleviprex, now off Goal SBP set at <180 On home BP meds with Clonidine, Atenolol, Plendil Added lisinopril, and increase clonidine Long-term BP goal normotensive  Fever, intermittent  UA WBC 6-30  Urine culture negative  CXR negative  Blood culture NGTD  On Zosyn for empiric  treatment  Hypokalemia  K 2.5-2.8-2.9-3.0-3.8  Continue K supplement  Mg 1.7-1.8  Give Mg supplement  Other Stroke Risk Factors  Advanced age  Hx stroke/TIA with residue speech difficulty, on DAPT at home  Other Active Problems  Hx of colon cancer  Baseline dementia and nonverbal   Left Adrenal nodule - hold off further workup as pt DNR and poor baseline condition  Right Lobe Liver nodule - hold off further workup as pt DNR and poor baseline condition  DNR status now  Hospital day # 7   Personally examined patient and images, and have participated in and made any corrections needed to history, physical, neuro exam,assessment and plan as stated above.  I have personally obtained the history, evaluated lab date, reviewed imaging studies and agree with radiology interpretations.    Sarina Ill, MD Stroke Neurology     To contact Stroke Continuity provider, please refer to [email or web address] After hours, contact General Neurology

## 2016-05-06 LAB — GLUCOSE, CAPILLARY
GLUCOSE-CAPILLARY: 125 mg/dL — AB (ref 65–99)
GLUCOSE-CAPILLARY: 132 mg/dL — AB (ref 65–99)

## 2016-05-06 NOTE — Plan of Care (Signed)
Problem: Fluid Volume: Goal: Ability to maintain a balanced intake and output will improve Outcome: Progressing taking little p.o.fluids but receiving IVF @ 38ml hr.

## 2016-05-06 NOTE — Progress Notes (Signed)
STROKE TEAM PROGRESS NOTE   SUBJECTIVE (INTERVAL HISTORY) No family at bedside today. pt to go to SNF for 2-3 days before transition to home [facility] program. No changes, patient stable.   OBJECTIVE Temp:  [98.1 F (36.7 C)-101 F (38.3 C)] 99.5 F (37.5 C) (01/07 0544) Pulse Rate:  [72-85] 74 (01/07 0544) Cardiac Rhythm: Normal sinus rhythm (01/07 0702) Resp:  [16-20] 18 (01/07 0544) BP: (136-176)/(67-79) 176/79 (01/07 0544) SpO2:  [97 %-100 %] 99 % (01/07 0544) Weight:  [62.5 kg (137 lb 12.8 oz)] 62.5 kg (137 lb 12.8 oz) (01/07 0500)  CBC:   Recent Labs Lab 05/03/16 0408 05/04/16 0622  WBC 8.7 7.9  HGB 10.6* 9.3*  HCT 32.2* 28.3*  MCV 85.4 86.8  PLT 183 [id number]    Basic Metabolic Panel:   Recent Labs Lab 05/03/16 0408  05/04/16 0622 05/04/16 1651 05/05/16 0726 05/05/16 1644  NA 138  --  139  --   --   --   K 3.0*  --  3.8  --   --   --   CL 106  --  108  --   --   --   CO2 25  --  24  --   --   --   GLUCOSE 138*  --  196*  --   --   --   BUN 15  --  25*  --   --   --   CREATININE 0.74  --  0.99  --   --   --   CALCIUM 8.1*  --  8.0*  --   --   --   MG 1.8  < >  --  2.0 1.9 1.8  PHOS  --   < >  --  2.9 3.5  --   < > = values in this interval not displayed. HgbA1c:  Lab Results  Component Value Date   HGBA1C 5.3 04/30/2016      IMAGING I have personally reviewed the radiological images below and agree with the radiology interpretations.  Mr Jodene Nam Head Wo Contrast 05/03/2016 1. Extremely limited study due to extensive motion artifact. 2. No AVM or other vascular abnormality seen underlying the left temporoparietal hematoma. 3. Question focal ectasia versus aneurysm at the cavernous right ICA as above. This could be further assessed with follow-up repeat study if the patient is able to hold still. Alternatively, CTA could also be performed. 4. No obvious large vessel occlusion or high-grade stenosis identified. Vertebrobasilar system is suspected to be diminutive,  and is extremely poorly evaluated on this exam.   Mr Brain Wo Contrast 05/03/2016 1. Limited study due to motion artifact and lack of IV contrast. 2. Similar size and appearance of large left temporoparietal intraparenchymal hematoma, measuring 68 x 35 x 61 mm on today's study. Similar associated mass effect and edema. Associated intraventricular extension with small volume intraventricular hemorrhage. Stable ventricular size without hydrocephalus. Subarachnoid hemorrhage likely related to redistribution. No obvious underlying mass lesion identified on this motion degraded noncontrast examination. No definite findings to suggest cerebral amyloid angiography identified. 3. Associated subdural extension with small left temporal parietal subdural hematoma present measuring up to 3 mm. 4. Trace 3 mm left-to-right midline shift.   Ct Head Wo Contrast  05/04/2016 Evolving LEFT cerebral hematoma with resolving intraventricular extension. No hydrocephalus. Stable 2 mm LEFT-to-RIGHT midline shift. Small amount of residual re- distributed subarachnoid hemorrhage.   05/01/2016 Evolving LEFT intraparenchymal hematoma with intraventricular extension. 2 mm LEFT-to-RIGHT midline shift.  Small amount of residual re- distributed subarachnoid hemorrhage.   04/29/2016 No increased bleeding or mass effect since yesterday. Some contraction of the hyperdense blood clot in the left parietal region. Interventricular communication without increased amount of intraventricular blood.  04/28/2016   Acute intraparenchymal hemorrhage in the left parietal lobe with hematoma measuring 7.8 x 3.6 x 6.6 cm, 97 cc volume. Intraventricular penetration with a small amount of blood dependent in the lateral ventricle on the right. Mass effect with flattening of the posterior aspect of the left lateral ventricle and 3 mm of left-to-right shift.   2D Echo  Normal LV size with mild focal basal septal hypertrophy. EF 60-65%. Normal RV size and  systolic function. No significant valvular abnormalities.  Ct Abdomen Pelvis W Contrast 04/27/2016 1. Moderate stool burden with rectal distention, concerning for fecal impaction. No bowel obstruction.  2. Small umbilical hernia contains noninflamed nonobstructed loop of small bowel and anti mesenteric border of transverse colon.  3. Gallstone without gallbladder inflammation.  4. An incidental finding of potential clinical significance has been found. Left adrenal nodule measures 2.4 cm. There are no prior exams for comparison. In the absence of malignancy history, recommend adrenal protocol CT.  5. **An incidental finding of potential clinical significance has been found. Ill-defined 2.2 cm hypodense lesion in the right lobe of the liver. Recommend nonemergent hepatic protocol MRI, preferably when patient is able tolerate breath hold technique.** multiple additional subcentimeter hepatic lesions are too small to characterize.  6. Atherosclerosis including coronary artery calcifications.  7. Multiple remote lumbar spine compression fractures.   Dg Chest Portable 1 View 04/27/2016 No acute cardiopulmonary process seen.    PHYSICAL EXAM  Temp:  [98.1 F (36.7 C)-101 F (38.3 C)] 99.5 F (37.5 C) (01/07 0544) Pulse Rate:  [72-85] 74 (01/07 0544) Resp:  [16-20] 18 (01/07 0544) BP: (136-176)/(67-79) 176/79 (01/07 0544) SpO2:  [97 %-100 %] 99 % (01/07 0544) Weight:  [62.5 kg (137 lb 12.8 oz)] 62.5 kg (137 lb 12.8 oz) (01/07 0500)   Exam stable today, no changes:   General - Well nourished, well developed, in no apparent distress.  Ophthalmologic - Fundi not visualized due to noncooperation.  Cardiovascular - Regular rate and rhythm.  Neuro - Sleepy but arousable with sternal rub, global aphasia. Not following commands. Right side neglect. Not blinking to visual threat on the right. PERRL, left gaze preference, not tracking to the right. Right nasolabial fold mild flattening. Tongue  in midline. RUE and RLE spontaneous movement and against gravity. Right UE and LE 1/5 on pain stimulation. No babinski, DTR 1+. Sensation, coordination and gait not tested.   ASSESSMENT/PLAN Ms. Francia Polter Tabbert is a 83 y.o. female with history of hypertension, dementia, previous CVA, baseline speech abnormality, urinary tract infections, peripheral vascular disease, colon cancer, chronic kidney disease, and anemia presenting with altered mental status and right hemiplegia.  She did not receive IV t-PA due to [place].  Stroke:  Acute L large parietal ICH with IVH, possibly hypertensive source vs CAA  Resultant  Neglect and right sided weakness - pt has baseline nonverbal  CT - Acute large ICH in the left parietal lobe with IVH.  repeat CT Head 04/29/16 - no increased hemotoma or IVH  Repeat CT head 05/01/16 - stable hematoma and IVH  Repeat CT head 05/04/16 - remains stable, resolving IVH, resdistributed SAH  MRI large L IPH w/ similar mass effect, edema, IVH, SAH, trace 52mm L to R shift  MRA no obvious large  or medium size vessel abnormality  Carotid Doppler - not indicated  2D Echo - EF 60-65%  LDL - 84  HgbA1c - 5.3  VTE prophylaxis - heparin subq DIET - DYS 1 Room service appropriate? Yes; Fluid consistency: Thin - plan to hold TF x 24h and d/c if intake ok after 24h  aspirin 81 mg daily and clopidogrel 75 mg daily prior to admission, now on no antithrombotics due to [place]  Ongoing aggressive stroke risk factor management  Therapy recommendations: SNF  Disposition: Pending (involved with PACE program in the past. spoke with Dr. Coralie Common of [facility]. Patient's condition is too low-level to be considered for PACE program at discharge. Dr. Coralie Common would like pt to be discharged to [facility] Monday morning and then transition to home PACE program after 2-3 days)  Hypertension  BP 170s/90s in setting of neuro symptoms, hemorrhage  Treated with Cleviprex, now off Goal SBP set at <180 On home BP  meds with Clonidine, Atenolol, Plendil Added lisinopril, and increase clonidine Long-term BP goal normotensive  Fever, intermittent  UA WBC 6-30  Urine culture negative  CXR negative  Blood culture NGTD  On Zosyn for empiric treatment  Hypokalemia  K 2.5-2.8-2.9-3.0-3.8  Continue K supplement  Mg 1.7-1.8  Give Mg supplement  Other Stroke Risk Factors  Advanced age  Hx stroke/TIA with residue speech difficulty, on DAPT at home  Other Active Problems  Hx of colon cancer  Baseline dementia and nonverbal   Left Adrenal nodule - hold off further workup as pt DNR and poor baseline condition  Right Lobe Liver nodule - hold off further workup as pt DNR and poor baseline condition  DNR status now  Hospital day # 8   Personally examined patient and images, and have participated in and made any corrections needed to history, physical, neuro exam,assessment and plan as stated above.  I have personally obtained the history, evaluated lab date, reviewed imaging studies and agree with radiology interpretations. 15 minutes were spent in the care of this patient.        To contact Stroke Continuity provider, please refer to [email or web address] After hours, contact General Neurology

## 2016-05-07 DIAGNOSIS — E278 Other specified disorders of adrenal gland: Secondary | ICD-10-CM

## 2016-05-07 DIAGNOSIS — Z8673 Personal history of transient ischemic attack (TIA), and cerebral infarction without residual deficits: Secondary | ICD-10-CM

## 2016-05-07 DIAGNOSIS — Z66 Do not resuscitate: Secondary | ICD-10-CM

## 2016-05-07 DIAGNOSIS — I615 Nontraumatic intracerebral hemorrhage, intraventricular: Secondary | ICD-10-CM

## 2016-05-07 DIAGNOSIS — K7689 Other specified diseases of liver: Secondary | ICD-10-CM

## 2016-05-07 DIAGNOSIS — E279 Disorder of adrenal gland, unspecified: Secondary | ICD-10-CM

## 2016-05-07 DIAGNOSIS — R509 Fever, unspecified: Secondary | ICD-10-CM | POA: Diagnosis not present

## 2016-05-07 DIAGNOSIS — I611 Nontraumatic intracerebral hemorrhage in hemisphere, cortical: Principal | ICD-10-CM

## 2016-05-07 LAB — GLUCOSE, CAPILLARY
GLUCOSE-CAPILLARY: 167 mg/dL — AB (ref 65–99)
Glucose-Capillary: 122 mg/dL — ABNORMAL HIGH (ref 65–99)
Glucose-Capillary: 184 mg/dL — ABNORMAL HIGH (ref 65–99)

## 2016-05-07 MED ORDER — LISINOPRIL 20 MG PO TABS: 20.0000 mg | ORAL_TABLET | Freq: Two times a day (BID) | ORAL | 2 refills | Status: DC

## 2016-05-07 MED ORDER — CLONIDINE HCL 0.2 MG PO TABS: 0.2000 mg | ORAL_TABLET | Freq: Three times a day (TID) | ORAL | 11 refills | Status: DC

## 2016-05-07 MED ORDER — DOCUSATE SODIUM 50 MG/5ML PO LIQD: 100.0000 mg | Freq: Two times a day (BID) | ORAL | 0 refills | Status: DC

## 2016-05-07 MED ORDER — SENNOSIDES-DOCUSATE SODIUM 8.6-50 MG PO TABS: 1.0000 | ORAL_TABLET | Freq: Two times a day (BID) | ORAL | 2 refills | Status: DC

## 2016-05-07 MED ORDER — POLYETHYLENE GLYCOL 3350 17 G PO PACK: 17.0000 g | PACK | Freq: Every day | ORAL | 0 refills | Status: DC | PRN

## 2016-05-07 MED ORDER — ENSURE ENLIVE PO LIQD: 237.0000 mL | Freq: Three times a day (TID) | ORAL | 12 refills | Status: DC

## 2016-05-07 NOTE — Clinical Social Work Note (Signed)
Pt is ready for discharge today and will go to [facility]. Pt's granddaughter is aware and agreeable to discharge plan. RN called report. Facility is ready to admit pt as they have receive discharge information. [facility] will provide transportation. CSW is signing off as no further needs identified.   PASARR has been verified, [id number].   Darden Dates, MSW, LCSW  Clinical Social Worker  [phone]

## 2016-05-07 NOTE — Clinical Social Work Placement (Signed)
   CLINICAL SOCIAL WORK PLACEMENT  NOTE  Date:  05/07/2016  Patient Details  Name: [name] MRN: [id number] Date of Birth: Dec 06, 1927  Clinical Social Work is seeking post-discharge placement for this patient at the [place] level of care (*CSW will initial, date and re-position this form in  chart as items are completed):  Yes   Patient/family provided with [facility] Work Department's list of facilities offering this level of care within the geographic area requested by the patient (or if unable, by the patient's family).  Yes   Patient/family informed of their freedom to choose among providers that offer the needed level of care, that participate in Medicare, Medicaid or managed care program needed by the patient, have an available bed and are willing to accept the patient.  Yes   Patient/family informed of [facility]'s ownership interest in [facility] and [facility], as well as of the fact that they are under no obligation to receive care at these facilities.  PASRR submitted to EDS on       PASRR number received on       Existing PASRR number confirmed on 05/07/16     FL2 transmitted to all facilities in geographic area requested by pt/family on 05/05/16     FL2 transmitted to all facilities within larger geographic area on       Patient informed that his/her managed care company has contracts with or will negotiate with certain facilities, including the following:            Patient/family informed of bed offers received.  Patient [facility] bed at [facility]     Physician recommends and patient chooses bed at      Patient to be transferred to [facility] on 05/07/16.  Patient to be transferred to facility by [facility]     Patient family notified on 05/07/16 of transfer.  Name of family member notified:  Pt's granddaughter     PHYSICIAN Please sign FL2, Please prepare priority discharge summary,  including medications, Please sign DNR     Additional Comment:    _______________________________________________ Darden Dates, LCSW 05/07/2016, 5:57 PM

## 2016-05-07 NOTE — Progress Notes (Signed)
Physical Therapy Treatment Patient Details Name: [name] MRN: [id number] DOB: December 18, 1927 Today's Date: 05/07/2016    History of Present Illness 83 y.o. female with a history of hypertension, dementia, chronic kidney disease, previous stroke , peripheral vascular disease and urinary tract  Infections, transferred from [facility] after CT scan of the head was obtained which showed a large left intracerebral hemorrhage with ventricular extension and mild mass effect.    PT Comments    Patient progressing with EOB activity, though remains fearful and pushing back with L UE.  Feel continued skilled PT in SNF setting appropriate for continued rehab.    Follow Up Recommendations  Supervision/Assistance - 24 hour     Equipment Recommendations  Other (comment) (TBA)    Recommendations for Other Services       Precautions / Restrictions Precautions Precautions: Fall    Mobility  Bed Mobility Overal bed mobility: Needs Assistance Bed Mobility: Supine to Sit     Supine to sit: HOB elevated;Total assist;+2 for physical assistance Sit to supine: Total assist;+2 for physical assistance   General bed mobility comments: Patient fearful and somewhat resistant to come upright  Transfers Overall transfer level: Needs assistance Equipment used: 2 person hand held assist Transfers: Sit to/from Stand Sit to Stand: Total assist;+2 physical assistance         General transfer comment: attempted to stand x 2, pt not supporting weight and fearful, so returned to sitting EOB  Ambulation/Gait                 Stairs            Wheelchair Mobility    Modified Rankin (Stroke Patients Only) Modified Rankin (Stroke Patients Only) Pre-Morbid Rankin Score: Moderately severe disability Modified Rankin: Severe disability     Balance Overall balance assessment: Needs assistance Sitting-balance support: No upper extremity supported;Feet unsupported;Single extremity  supported Sitting balance-Leahy Scale: Poor Sitting balance - Comments: leaning back (at times pushing with L UE) mod to max support for EOB x about 8 minutes with attempts for pt to relax L UE and support balance fluctuated to about 50% help at best Postural control: Posterior lean Standing balance support: Single extremity supported Standing balance-Leahy Scale: Zero Standing balance comment: unable to maintain standing wtih +2 A                    Cognition Arousal/Alertness: Awake/alert Behavior During Therapy: Anxious Overall Cognitive Status: Difficult to assess                      Exercises      General Comments        Pertinent Vitals/Pain Faces Pain Scale: No hurt    Home Living                      Prior Function            PT Goals (current goals can now be found in the care plan section) Progress towards PT goals: Progressing toward goals    Frequency    Min 3X/week      PT Plan Current plan remains appropriate    Co-evaluation             End of Session Equipment Utilized During Treatment: Gait belt Activity Tolerance: Patient limited by fatigue Patient left: with call bell/phone within reach;in bed     Time: 1443-1500 PT Time Calculation (min) (ACUTE ONLY): 17 min  Charges:  $Therapeutic Activity: 8-22 mins                    G Codes:      Reginia Naas 2016-05-15, 4:03 PM  Magda Kiel, [place] 05-15-2016

## 2016-05-07 NOTE — Discharge Summary (Signed)
Stroke Discharge Summary  Patient ID: [name]   MRN: [id number]      DOB: 1928-01-30  Date of Admission: 04/28/2016 Date of Discharge: 05/07/2016  Attending Physician:  Garvin Fila, MD, Stroke MD Consulting Physician(s):    None  Patient's PCP:  Gareth Morgan, MD  DISCHARGE DIAGNOSIS:  Principal Problem:   ICH (intracerebral hemorrhage) ([place]) - L Parietal ICH, HTN vs CAA source Active Problems:   Alzheimer's dementia   HTN (hypertension)   Hypokalemia   Malignant hypertension   IVH (intraventricular hemorrhage) ([place])   Fever in adult   Hx of ischemic left MCA stroke   Adrenal nodule ([place])   Liver nodule   DNR (do not resuscitate)  BMI: Body mass index is 23.19 kg/m.  Past Medical History:  Diagnosis Date  . Alzheimer's dementia   . Anemia   . CKD (chronic kidney disease)    stage 3  . Colon cancer ([place])   . Constipation   . CVA (cerebral vascular accident) ([place])   . Dementia   . Hypertension   . PVD (peripheral vascular disease) ([place])   . UTI (urinary tract infection)    Past Surgical History:  Procedure Laterality Date  . NO PAST SURGERIES      Allergies as of 05/07/2016   No Known Allergies     Medication List    STOP taking these medications   aspirin EC 81 MG tablet   azelastine 0.1 % nasal spray Commonly known as:  ASTELIN   CALCIUM 600+D3 600-400 MG-UNIT Tabs Generic drug:  Calcium Carbonate-Vitamin D3   cefTRIAXone 2-2.22 GM-% IVPB Commonly known as:  ROCEPHIN   clopidogrel 75 MG tablet Commonly known as:  PLAVIX   guaiFENesin-dextromethorphan 100-10 MG/5ML syrup Commonly known as:  ROBITUSSIN DM   hydrALAZINE 20 MG/ML injection Commonly known as:  APRESOLINE   hydrochlorothiazide 12.5 MG capsule Commonly known as:  MICROZIDE   labetalol 5 MG/ML injection Commonly known as:  NORMODYNE,TRANDATE   loratadine 10 MG tablet Commonly known as:  CLARITIN   magnesium hydroxide 400 MG/5ML suspension Commonly known as:  MILK OF  MAGNESIA   ondansetron 4 MG tablet Commonly known as:  ZOFRAN   potassium chloride SA 20 MEQ tablet Commonly known as:  K-DUR,KLOR-CON   sodium chloride (hypertonic) 3 % solution   traZODone 100 MG tablet Commonly known as:  DESYREL     TAKE these medications   acetaminophen 325 MG tablet Commonly known as:  TYLENOL Take 2 tablets (650 mg total) by mouth every 6 (six) hours as needed for mild pain (or Fever >/= 101).   acetaminophen 650 MG suppository Commonly known as:  TYLENOL Place 1 suppository (650 mg total) rectally every 6 (six) hours as needed for mild pain (or Fever >/= 101).   atenolol 50 MG tablet Commonly known as:  TENORMIN Take 50 mg by mouth daily.   cloNIDine 0.2 MG tablet Commonly known as:  CATAPRES Take 1 tablet (0.2 mg total) by mouth 3 (three) times daily. What changed:  when to take this   docusate 50 MG/5ML liquid Commonly known as:  COLACE Take 10 mLs (100 mg total) by mouth 2 (two) times daily.   famotidine 20 MG tablet Commonly known as:  PEPCID Take 1 tablet (20 mg total) by mouth daily.   feeding supplement (ENSURE ENLIVE) Liqd Take 237 mLs by mouth 3 (three) times daily between meals.   felodipine 10 MG 24 hr tablet Commonly known as:  PLENDIL  Take 1 tablet (10 mg total) by mouth daily.   lisinopril 20 MG tablet Commonly known as:  PRINIVIL,ZESTRIL Take 1 tablet (20 mg total) by mouth 2 (two) times daily.   memantine 5 MG tablet Commonly known as:  NAMENDA Take 1 tablet (5 mg total) by mouth daily.   polyethylene glycol packet Commonly known as:  MIRALAX / GLYCOLAX Take 17 g by mouth daily as needed for moderate constipation.   senna-docusate 8.6-50 MG tablet Commonly known as:  Senokot-S Take 1 tablet by mouth 2 (two) times daily. What changed:  when to take this       LABORATORY STUDIES CBC    Component Value Date/Time   WBC 7.9 05/04/2016 0622   RBC 3.26 (L) 05/04/2016 0622   HGB 9.3 (L) 05/04/2016 0622   HGB  10.9 (L) 12/12/2013 0934   HCT 28.3 (L) 05/04/2016 0622   HCT 34.0 (L) 12/12/2013 0934   PLT 173 05/04/2016 0622   PLT 193 12/12/2013 0934   MCV 86.8 05/04/2016 0622   MCV 91 12/12/2013 0934   MCH 28.5 05/04/2016 0622   MCHC 32.9 05/04/2016 0622   RDW 13.3 05/04/2016 0622   RDW 13.2 12/12/2013 0934   LYMPHSABS 1.0 02/17/2015 2257   MONOABS 0.5 02/17/2015 2257   EOSABS 0.2 02/17/2015 2257   BASOSABS 0.1 02/17/2015 2257   CMP    Component Value Date/Time   NA 139 05/04/2016 0622   NA 147 (H) 12/12/2013 0934   K 3.8 05/04/2016 0622   K 3.8 12/12/2013 0934   CL 108 05/04/2016 0622   CL 108 (H) 12/12/2013 0934   CO2 24 05/04/2016 0622   CO2 32 12/12/2013 0934   GLUCOSE 196 (H) 05/04/2016 0622   GLUCOSE 144 (H) 12/12/2013 0934   BUN 25 (H) 05/04/2016 0622   BUN 25 (H) 12/12/2013 0934   CREATININE 0.99 05/04/2016 0622   CREATININE 1.31 (H) 12/12/2013 0934   CALCIUM 8.0 (L) 05/04/2016 0622   CALCIUM 8.2 (L) 12/12/2013 0934   PROT 7.3 04/27/2016 1810   PROT 5.6 (L) 12/12/2013 0934   ALBUMIN 3.8 04/27/2016 1810   ALBUMIN 2.7 (L) 12/12/2013 0934   AST 38 04/27/2016 1810   AST 18 12/12/2013 0934   ALT 16 04/27/2016 1810   ALT 17 12/12/2013 0934   ALKPHOS 74 04/27/2016 1810   ALKPHOS 60 12/12/2013 0934   BILITOT 0.5 04/27/2016 1810   BILITOT 0.3 12/12/2013 0934   GFRNONAA 50 (L) 05/04/2016 0622   GFRNONAA 38 (L) 12/12/2013 0934   GFRAA 58 (L) 05/04/2016 0622   GFRAA 44 (L) 12/12/2013 0934   COAGSNo results found for: INR, PROTIME Lipid Panel    Component Value Date/Time   CHOL 139 05/01/2016 0406   TRIG 69 05/01/2016 0406   HDL 41 05/01/2016 0406   CHOLHDL 3.4 05/01/2016 0406   VLDL 14 05/01/2016 0406   LDLCALC 84 05/01/2016 0406   HgbA1C  Lab Results  Component Value Date   HGBA1C 5.3 04/30/2016   Urinalysis    Component Value Date/Time   COLORURINE YELLOW (A) 04/27/2016 1917   APPEARANCEUR CLEAR (A) 04/27/2016 1917   APPEARANCEUR Cloudy 12/12/2013 1030    LABSPEC 1.017 04/27/2016 1917   LABSPEC 1.024 12/12/2013 1030   PHURINE 6.0 04/27/2016 1917   GLUCOSEU 150 (A) 04/27/2016 1917   GLUCOSEU Negative 12/12/2013 1030   HGBUR NEGATIVE 04/27/2016 [place] NEGATIVE 04/27/2016 1917   BILIRUBINUR Negative 12/12/2013 1030   KETONESUR 20 (A) 04/27/2016 1917  PROTEINUR 30 (A) 04/27/2016 1917   NITRITE NEGATIVE 04/27/2016 1917   LEUKOCYTESUR TRACE (A) 04/27/2016 1917   LEUKOCYTESUR Negative 12/12/2013 1030    SIGNIFICANT DIAGNOSTIC STUDIES  Ct Head Wo Contrast 05/04/2016 Evolving LEFT cerebral hematoma with resolving intraventricular extension. No hydrocephalus. Stable 2 mm LEFT-to-RIGHT midline shift. Small amount of residual re- distributed subarachnoid hemorrhage.  05/01/2016 Evolving LEFT intraparenchymal hematoma with intraventricular extension. 2 mm LEFT-to-RIGHT midline shift. Small amount of residual re- distributed subarachnoid hemorrhage.  04/29/2016 No increased bleeding or mass effect since yesterday. Some contraction of the hyperdense blood clot in the left parietal region. Interventricular communication without increased amount of intraventricular blood. 04/28/2016   Acute intraparenchymal hemorrhage in the left parietal lobe with hematoma measuring 7.8 x 3.6 x 6.6 cm, 97 cc volume. Intraventricular penetration with a small amount of blood dependent in the lateral ventricle on the right. Mass effect with flattening of the posterior aspect of the left lateral ventricle and 3 mm of left-to-right shift.   Mr Jodene Nam Head Wo Contrast 05/03/2016 1. Extremely limited study due to extensive motion artifact. 2. No AVM or other vascular abnormality seen underlying the left temporoparietal hematoma. 3. Question focal ectasia versus aneurysm at the cavernous right ICA as above. This could be further assessed with follow-up repeat study if the patient is able to hold still. Alternatively, CTA could also be performed. 4. No obvious large vessel  occlusion or high-grade stenosis identified. Vertebrobasilar system is suspected to be diminutive, and is extremely poorly evaluated on this exam.   Mr Brain Wo Contrast 05/03/2016 1. Limited study due to motion artifact and lack of IV contrast. 2. Similar size and appearance of large left temporoparietal intraparenchymal hematoma, measuring 68 x 35 x 61 mm on today's study. Similar associated mass effect and edema. Associated intraventricular extension with small volume intraventricular hemorrhage. Stable ventricular size without hydrocephalus. Subarachnoid hemorrhage likely related to redistribution. No obvious underlying mass lesion identified on this motion degraded noncontrast examination. No definite findings to suggest cerebral amyloid angiography identified. 3. Associated subdural extension with small left temporal parietal subdural hematoma present measuring up to 3 mm. 4. Trace 3 mm left-to-right midline shift.   2D Echo  Normal LV size with mild focal basal septal hypertrophy. EF60-65%. Normal RV size and systolic function. No significantvalvular abnormalities.  Ct Abdomen Pelvis W Contrast 04/27/2016 1. Moderate stool burden with rectal distention, concerning for fecal impaction. No bowel obstruction.  2. Small umbilical hernia contains noninflamed nonobstructed loop of small bowel and anti mesenteric border of transverse colon.  3. Gallstone without gallbladder inflammation.  4. An incidental finding of potential clinical significance has been found. Left adrenal nodule measures 2.4 cm. There are no prior exams for comparison. In the absence of malignancy history, recommend adrenal protocol CT.  5. **An incidental finding of potential clinical significance has been found. Ill-defined 2.2 cm hypodense lesion in the right lobe of the liver. Recommend nonemergent hepatic protocol MRI, preferably when patient is able tolerate breath hold technique.** multiple additional subcentimeter  hepatic lesions are too small to characterize.  6. Atherosclerosis including coronary artery calcifications.  7. Multiple remote lumbar spine compression fractures.   Dg Chest Portable 1 View 04/27/2016 No acute cardiopulmonary process seen.      HISTORY OF PRESENT ILLNESS [name] an 83 y.o.femalewith a history of hypertension, dementia, chronic kidney disease, previous stroke , peripheral vascular disease and urinary tract Infections, transferred from [facility] after CT scan of the head was obtained which showed  a large left intracerebral hemorrhage with ventricular extension and mild mass effect. Onset is unclear. She was seen initially at [facility] on the morning of 04/27/2016 for altered mental status and thought to have recurrent urinary tract infection. She was given IM antibiotics. She was brought back to [facility] the same afternoon after vomiting blood and was admitted. Patient has been taking aspirin 81 mg per day. CT scan of her head was obtained because of persistent mental status changes. Study showed a large left parietal intraparenchymal hemorrhage with ventricular extension. Volume was 97 cm. Patient had premorbid speech abnormality with minimal speech output. Right hemiplegia is new. NIH stroke score was 21. She was LKW 04/26/2016. tPA was not given due to [place]. She was admitted to the neuro ICU at [facility] for further evaluation and treatment.    HOSPITAL COURSE [name] is a 83 y.o. female with history of hypertension, dementia, previous CVA, baseline speech abnormality, urinary tract infections, peripheral vascular disease, colon cancer, chronic kidney disease, and anemia presenting with altered mental status and right hemiplegia.  She did not receive IV t-PA due to [place].  Stroke:  Acute L large parietal ICH with IVH, possibly hypertensive source vs CAA  Resultant  Neglect and right sided weakness - pt has baseline nonverbal  CT - Acute large ICH in the left parietal  lobe with IVH.  repeat CT Head 04/29/16 - no increased hemotoma or IVH  Repeat CT head 05/01/16 - stable hematoma and IVH  Repeat CT head 05/04/16 - remains stable, resolving IVH, resdistributed SAH  MRI large L IPH w/ similar mass effect, edema, IVH, SAH, trace 108mm L to R shift  MRA no obvious large or medium size vessel abnormality  Carotid Doppler - not indicated  2D Echo - EF 60-65%  LDL - 84  HgbA1c - 5.3  Fluid consistency: Thin - treated with TF in the hospital. Slow increase in po intake. Need to monitor at discharge, including fluid intake.  aspirin 81 mg daily and clopidogrel 75 mg daily prior to admission, stopped antithrombotics due to [place]  Therapy recommendations: SNF  Disposition: SNF (involved with [facility] in the past. spoke with Dr. Coralie Common of [facility]. Patient's condition is too low-level to be considered for [facility] program at discharge. Dr. Coralie Common would like pt to be discharged to SNF then transition to home [facility] program after 2-3 days. PASSAR # obtained with plan for discharge)  Hypertension Hypertensive Emergency  BP 170s/90s in setting of neuro symptoms, hemorrhage  Treated with Cleviprex initially  Initial SBP Goal < 160 then increased to 180  On home BP meds with Clonidine, Atenolol, Plendil  Added lisinopril, and increase clonidine  Long-term BP goal normotensive  Fever, intermittent  UA WBC 6-30  Urine culture negative  CXR negative  Blood culture NGTD  Zosyn for empiric treatment  Hypokalemia  K 2.5->3.8  K supplemented, now off  Mg 1.7->1.8  Mg supplemented now off  Continue to monitor as OP  Other Stroke Risk Factors  Advanced age  Hx stroke/TIA with residue speech difficulty, on DAPT at home PTA  Other Active Problems  Hx of colon cancer  Baseline dementia and nonverbal   Left Adrenal nodule - hold off further workup as pt DNR and poor baseline condition  Right Lobe Liver nodule - hold off further workup as  pt DNR and poor baseline condition  DNR status now   DISCHARGE EXAM Blood pressure (!) 163/78, pulse 67, temperature 98.2 F (36.8 C),  temperature source Axillary, resp. rate 16, height 5\' 4"  (1.626 m), weight 61.3 kg (135 lb 1.6 oz), SpO2 100 %. General - Well nourished, well developed, in no apparent distress.  Ophthalmologic - Fundi not visualized due to noncooperation.  Cardiovascular - Regular rate and rhythm.  Neuro - Sleepy but arousable with sternal rub, global aphasia. Not following commands. Right side neglect. Not blinking to visual threat on the right. PERRL, left gaze preference, not tracking to the right. Right nasolabial fold mild flattening. Tongue in midline. RUE and RLE spontaneous movement and against gravity. Right UE and LE 1/5 on pain stimulation. No babinski, DTR 1+. Sensation, coordination and gait not tested.    Discharge Diet   DIET - DYS 1 Room service appropriate? Yes; Fluid consistency: Thin liquids  DISCHARGE PLAN  Disposition:  Discharge to skilled nursing facility for ongoing PT, OT and ST with plans to transition to home and [facility] program  No antithrombotics given intracerebral hemorrhage  Ongoing risk factor control by Primary Care Physician at time of discharge - normotensive BP  Follow-up Marie A REILLY, MD in 2 weeks.  Follow-up with Dr. Antony Contras, Stroke Clinic in 6 weeks, office to schedule an appointment.  45 minutes were spent preparing discharge.  [facility] for Pager information 05/07/2016 9:36 AM  I have personally examined this patient, reviewed notes, independently viewed imaging studies, participated in medical decision making and plan of care.ROS completed by me personally and pertinent positives fully documented  I have made any additions or clarifications directly to the above note. Agree with note above.    Antony Contras, MD Medical Director [facility] Pager:  [phone] 05/07/2016 4:02 PM

## 2016-05-07 NOTE — Care Management Important Message (Signed)
Important Message  Patient Details  Name: [name] MRN: [id number] Date of Birth: July 05, 1927   Medicare Important Message Given:  Yes    Orbie Pyo 05/07/2016, 3:32 PM

## 2016-05-07 NOTE — Care Management Note (Signed)
Case Management Note  Patient Details  Name: [name] MRN: [id number] Date of Birth: Apr 08, 1928  Subjective/Objective:                    Action/Plan: Pt discharging to [facility] today. Pts daughter in law to sign paperwork at [facility]. [facility] called and asked that patient travel by [facility] and have d/c paperwork and medications faxed to [facility] since they will be covering the medications at [facility]. Information faxed to number provided: [phone]. CSW informed of the transportation needs.   Expected Discharge Date:                  Expected Discharge Plan:  Skilled Nursing Facility  In-House Referral:  Clinical Social Work  Discharge planning Services  CM Consult  Post Acute Care Choice:    Choice offered to:     DME Arranged:    DME Agency:     HH Arranged:    [place] Agency:     Status of Service:  Completed, signed off  If discussed at [employer], dates discussed:    Additional Comments:  Pollie Friar, RN 05/07/2016, 12:00 PM

## 2020-05-25 ENCOUNTER — Other Ambulatory Visit: Payer: Self-pay

## 2020-05-25 ENCOUNTER — Encounter: Payer: Medicare (Managed Care) | Attending: Internal Medicine | Admitting: Internal Medicine

## 2020-05-25 DIAGNOSIS — I10 Essential (primary) hypertension: Secondary | ICD-10-CM | POA: Insufficient documentation

## 2020-05-25 DIAGNOSIS — G309 Alzheimer's disease, unspecified: Secondary | ICD-10-CM | POA: Diagnosis not present

## 2020-05-25 DIAGNOSIS — L98498 Non-pressure chronic ulcer of skin of other sites with other specified severity: Secondary | ICD-10-CM | POA: Diagnosis not present

## 2020-05-25 DIAGNOSIS — M623 Immobility syndrome (paraplegic): Secondary | ICD-10-CM | POA: Diagnosis not present

## 2020-05-25 DIAGNOSIS — F0280 Dementia in other diseases classified elsewhere without behavioral disturbance: Secondary | ICD-10-CM | POA: Diagnosis not present

## 2020-05-25 DIAGNOSIS — Z87891 Personal history of nicotine dependence: Secondary | ICD-10-CM | POA: Diagnosis not present

## 2020-05-26 NOTE — Progress Notes (Addendum)
JOHANNY, ORRICK ([id number]) Visit Report for 05/25/2020 Allergy List Details Patient Name: [name], [name]. Date of Service: 05/25/2020 1:00 PM Medical Record Number: [id number] Patient Account Number: [id number] Date of Birth/Sex: [birth year] (87 y.o. F) Treating RN: Carlene Coria Primary Care Versia Mignogna: Durenda Guthrie Other Clinician: Referring Tawsha Terrero: Durenda Guthrie Treating Rianna Lukes/Extender: Tito Dine in Treatment: 0 Allergies Active Allergies No Known Allergies Type: Allergen Allergy Notes Electronic Signature(s) Signed: 05/26/2020 11:33:31 AM By: Carlene Coria RN Entered By: Carlene Coria on 05/25/2020 13:44:15 Sennett, [name] ([id number]) -------------------------------------------------------------------------------- Arrival Information Details Patient Name: [name]. Date of Service: 05/25/2020 1:00 PM Medical Record Number: [id number] Patient Account Number: [id number] Date of Birth/Sex: 06-04-1927 (87 y.o. F) Treating RN: Carlene Coria Primary Care Lorianna Spadaccini: Durenda Guthrie Other Clinician: Referring Ashden Sonnenberg: Durenda Guthrie Treating Kendall Arnell/Extender: Tito Dine in Treatment: 0 Visit Information Patient Arrived: Wheel Chair Arrival Time: 13:22 Accompanied By: daughter Transfer Assistance: None Patient Identification Verified: Yes Secondary Verification Process Completed: Yes Patient Requires Transmission-Based Precautions: No Patient Has Alerts: No Electronic Signature(s) Signed: 05/26/2020 11:33:31 AM By: Carlene Coria RN Entered By: Carlene Coria on 05/25/2020 13:23:42 Kugel, [name] ([id number]) -------------------------------------------------------------------------------- Clinic Level of Care Assessment Details Patient Name: [name]. Date of Service: 05/25/2020 1:00 PM Medical Record Number: [id number] Patient Account Number: [id number] Date of Birth/Sex: 06/03/1927 (87 y.o. F) Treating RN: Cornell Barman Primary Care  Alexarae Oliva: Durenda Guthrie Other Clinician: Referring Pricila Bridge: Durenda Guthrie Treating Diksha Tagliaferro/Extender: Tito Dine in Treatment: 0 Clinic Level of Care Assessment Items TOOL 2 Quantity Score []  - Use when only an EandM is performed on the INITIAL visit 0 ASSESSMENTS - Nursing Assessment / Reassessment X - General Physical Exam (combine w/ comprehensive assessment (listed just below) when performed on new 1 20 pt. evals) X- 1 25 Comprehensive Assessment (HX, ROS, Risk Assessments, Wounds Hx, etc.) ASSESSMENTS - Wound and Skin Assessment / Reassessment X - Simple Wound Assessment / Reassessment - one wound 1 5 []  - 0 Complex Wound Assessment / Reassessment - multiple wounds []  - 0 Dermatologic / Skin Assessment (not related to wound area) ASSESSMENTS - Ostomy and/or Continence Assessment and Care []  - Incontinence Assessment and Management 0 []  - 0 Ostomy Care Assessment and Management (repouching, etc.) PROCESS - Coordination of Care X - Simple Patient / Family Education for ongoing care 1 15 []  - 0 Complex (extensive) Patient / Family Education for ongoing care []  - 0 Staff obtains Programmer, systems, Records, Test Results / Process Orders []  - 0 Staff telephones HHA, Nursing Homes / Clarify orders / etc []  - 0 Routine Transfer to another Facility (non-emergent condition) []  - 0 Routine Hospital Admission (non-emergent condition) X- 1 15 New Admissions / Biomedical engineer / Ordering NPWT, Apligraf, etc. []  - 0 Emergency Hospital Admission (emergent condition) X- 1 10 Simple Discharge Coordination []  - 0 Complex (extensive) Discharge Coordination PROCESS - Special Needs []  - Pediatric / Minor Patient Management 0 []  - 0 Isolation Patient Management []  - 0 Hearing / Language / Visual special needs []  - 0 Assessment of Community assistance (transportation, D/C planning, etc.) []  - 0 Additional assistance / Altered mentation []  - 0 Support Surface(s)  Assessment (bed, cushion, seat, etc.) INTERVENTIONS - Wound Cleansing / Measurement X - Wound Imaging (photographs - any number of wounds) 1 5 []  - 0 Wound Tracing (instead of photographs) X- 1 5 Simple Wound Measurement - one wound []  - 0 Complex Wound Measurement - multiple wounds Parson, Cooper M. ([id number])  X- 1 5 Simple Wound Cleansing - one wound []  - 0 Complex Wound Cleansing - multiple wounds INTERVENTIONS - Wound Dressings []  - Small Wound Dressing one or multiple wounds 0 X- 1 15 Medium Wound Dressing one or multiple wounds []  - 0 Large Wound Dressing one or multiple wounds []  - 0 Application of Medications - injection INTERVENTIONS - Miscellaneous []  - External ear exam 0 []  - 0 Specimen Collection (cultures, biopsies, blood, body fluids, etc.) []  - 0 Specimen(s) / Culture(s) sent or taken to Lab for analysis []  - 0 Patient Transfer (multiple staff / Civil Service fast streamer / Similar devices) []  - 0 Simple Staple / Suture removal (25 or less) []  - 0 Complex Staple / Suture removal (26 or more) []  - 0 Hypo / Hyperglycemic Management (close monitor of Blood Glucose) X- 1 15 Ankle / Brachial Index (ABI) - do not check if billed separately Has the patient been seen at the hospital within the last three years: Yes Total Score: 135 Level Of Care: New/Established - Level 4 Electronic Signature(s) Signed: 05/25/2020 5:40:58 PM By: Gretta Cool, BSN, RN, CWS, Kim RN, BSN Entered By: Gretta Cool, BSN, RN, CWS, Kim on 05/25/2020 14:08:22 Bells, [name] ([id number]) -------------------------------------------------------------------------------- Encounter Discharge Information Details Patient Name: [name]. Date of Service: 05/25/2020 1:00 PM Medical Record Number: [id number] Patient Account Number: [id number] Date of Birth/Sex: 1928-01-14 (87 y.o. F) Treating RN: Cornell Barman Primary Care Rei Contee: Durenda Guthrie Other Clinician: Referring Cashmere Dingley: Durenda Guthrie Treating  Arnav Cregg/Extender: Tito Dine in Treatment: 0 Encounter Discharge Information Items Discharge Condition: Stable Ambulatory Status: Wheelchair Discharge Destination: Home Transportation: Private Auto Accompanied By: daughter Schedule Follow-up Appointment: Yes Clinical Summary of Care: Electronic Signature(s) Signed: 05/25/2020 5:40:58 PM By: Gretta Cool, BSN, RN, CWS, Kim RN, BSN Entered By: Gretta Cool, BSN, RN, CWS, Kim on 05/25/2020 14:15:40 Grinnell, [name] ([id number]) -------------------------------------------------------------------------------- Lower Extremity Assessment Details Patient Name: [name], [name]. Date of Service: 05/25/2020 1:00 PM Medical Record Number: [id number] Patient Account Number: [id number] Date of Birth/Sex: 01-01-1928 (87 y.o. F) Treating RN: Carlene Coria Primary Care Callen Vancuren: Durenda Guthrie Other Clinician: Referring Jametta Moorehead: Durenda Guthrie Treating Loistine Eberlin/Extender: Tito Dine in Treatment: 0 Edema Assessment Assessed: [Left: No] [Right: No] [Left: Edema] [Right: :] Calf Left: Right: Point of Measurement: 31 cm From Medial Instep 30 cm Ankle Left: Right: Point of Measurement: 8 cm From Medial Instep 21 cm Knee To Floor Left: Right: From Medial Instep 35 cm Vascular Assessment Pulses: Dorsalis Pedis Palpable: [Left:Yes] [Right:Yes] Blood Pressure: Brachial: [Right:145] Ankle: [Right:Dorsalis Pedis: 60 0.41] Electronic Signature(s) Signed: 05/26/2020 11:33:31 AM By: Carlene Coria RN Entered By: Carlene Coria on 05/25/2020 15:58:13 Constante, Fleda M. ([id number]) -------------------------------------------------------------------------------- Multi Wound Chart Details Patient Name: [name]. Date of Service: 05/25/2020 1:00 PM Medical Record Number: [id number] Patient Account Number: [id number] Date of Birth/Sex: 16-May-1927 (87 y.o. F) Treating RN: Cornell Barman Primary Care Martinez Boxx: Durenda Guthrie Other  Clinician: Referring Josemiguel Gries: Durenda Guthrie Treating Xitlally Mooneyham/Extender: Tito Dine in Treatment: 0 Vital Signs Height(in): 62 Pulse(bpm): 55 Weight(lbs): 140 Blood Pressure(mmHg): 145/74 Body Mass Index(BMI): 26 Temperature(F): 98.6 Respiratory Rate(breaths/min): 18 Photos: [N/A:N/A] Wound Location: Right, Lateral Lower Leg N/A N/A Wounding Event: Gradually Appeared N/A N/A Primary Etiology: Atypical N/A N/A Comorbid History: Hypertension, Peripheral Venous N/A N/A Disease Date Acquired: 04/30/2017 N/A N/A Weeks of Treatment: 0 N/A N/A Wound Status: Open N/A N/A Measurements L x W x D (cm) 2.5x2.7x1 N/A N/A Area (cm) : 5.301 N/A N/A Volume (cm) : 5.301 N/A  N/A % Reduction in Area: 0.00% N/A N/A % Reduction in Volume: 0.00% N/A N/A Starting Position 1 (o'clock): 10 Ending Position 1 (o'clock): 3 Maximum Distance 1 (cm): 2 Undermining: Yes N/A N/A Classification: Full Thickness Without Exposed N/A N/A Support Structures Exudate Amount: Medium N/A N/A Exudate Type: Serosanguineous N/A N/A Exudate Color: red, brown N/A N/A Wound Margin: Flat and Intact N/A N/A Granulation Amount: Small (1-33%) N/A N/A Granulation Quality: Red, Pink N/A N/A Necrotic Amount: Large (67-100%) N/A N/A Exposed Structures: Fat Layer (Subcutaneous Tissue): N/A N/A Yes Fascia: No Tendon: No Muscle: No Joint: No Bone: No Epithelialization: None N/A N/A Treatment Notes Wound #1 (Lower Leg) Wound Laterality: Right, Lateral Cleanser Cohen, Decie M. ([id number]) Peri-Wound Care Topical Primary Dressing Prisma 4.34 (in) Quantity: 1 Discharge Instruction: Moisten w/normal saline or sterile water; Cover wound as directed. Do not remove from wound bed. Secondary Dressing Mepilex Border Flex, 4x4 (in/in) Quantity: 1 Discharge Instruction: Apply to wound as directed. Do not cut. Secured With Compression Wrap Compression Stockings Environmental education officer) Signed:  05/26/2020 11:56:06 AM By: Linton Ham MD Entered By: Linton Ham on 05/25/2020 14:18:57 Shorb, [name] ([id number]) -------------------------------------------------------------------------------- [place] Details Patient Name: [name], [name]. Date of Service: 05/25/2020 1:00 PM Medical Record Number: [id number] Patient Account Number: [id number] Date of Birth/Sex: [birth year] (87 y.o. F) Treating RN: Carlene Coria Primary Care Lianette Broussard: Durenda Guthrie Other Clinician: Referring Arinze Rivadeneira: Durenda Guthrie Treating Cleveland Paiz/Extender: Tito Dine in Treatment: 0 Active Inactive Electronic Signature(s) Signed: 06/01/2020 9:57:53 AM By: Gretta Cool, BSN, RN, CWS, Kim RN, BSN Signed: 06/13/2020 4:09:26 PM By: Carlene Coria RN Previous Signature: 05/25/2020 5:40:58 PM Version By: Gretta Cool BSN, RN, CWS, Kim RN, BSN Previous Signature: 05/26/2020 11:33:31 AM Version By: Carlene Coria RN Entered By: Gretta Cool, BSN, RN, CWS, Kim on 06/01/2020 09:57:53 Trampe, [name] ([id number]) -------------------------------------------------------------------------------- Pain Assessment Details Patient Name: [name], [name]. Date of Service: 05/25/2020 1:00 PM Medical Record Number: [id number] Patient Account Number: [id number] Date of Birth/Sex: [birth year] (87 y.o. F) Treating RN: Carlene Coria Primary Care Johnpatrick Jenny: Durenda Guthrie Other Clinician: Referring Kery Batzel: Durenda Guthrie Treating Terianne Thaker/Extender: Tito Dine in Treatment: 0 Active Problems Location of Pain Severity and Description of Pain Patient Has Paino No Site Locations Pain Management and Medication Current Pain Management: Electronic Signature(s) Signed: 05/26/2020 11:33:31 AM By: Carlene Coria RN Entered By: Carlene Coria on 05/25/2020 13:23:57 Philipson, [name] ([id number]) -------------------------------------------------------------------------------- Patient/Caregiver Education Details Patient Name:  [name]. Date of Service: 05/25/2020 1:00 PM Medical Record Number: [id number] Patient Account Number: [id number] Date of Birth/Gender: 1927/11/03 (87 y.o. F) Treating RN: Cornell Barman Primary Care Physician: Durenda Guthrie Other Clinician: Referring Physician: Durenda Guthrie Treating Physician/Extender: Tito Dine in Treatment: 0 Education Assessment Education Provided To: Patient Education Topics Provided Welcome To [facility]: Handouts: Welcome To [facility] Methods: Demonstration, Explain/Verbal Responses: State content correctly Wound/Skin Impairment: Handouts: Caring for Your Ulcer Methods: Demonstration, Explain/Verbal Responses: State content correctly Electronic Signature(s) Signed: 05/25/2020 5:40:58 PM By: Gretta Cool, BSN, RN, CWS, Kim RN, BSN Entered By: Gretta Cool, BSN, RN, CWS, Kim on 05/25/2020 14:14:45 Riepe, [name] ([id number]) -------------------------------------------------------------------------------- Wound Assessment Details Patient Name: [name], [name]. Date of Service: 05/25/2020 1:00 PM Medical Record Number: [id number] Patient Account Number: [id number] Date of Birth/Sex: July 04, 1927 (87 y.o. F) Treating RN: Carlene Coria Primary Care Angad Nabers: Durenda Guthrie Other Clinician: Referring Lauren Modisette: Durenda Guthrie Treating Zyasia Halbleib/Extender: Tito Dine in Treatment: 0 Wound Status Wound Number: 1 Primary Etiology:  Atypical Wound Location: Right, Lateral Lower Leg Wound Status: Open Wounding Event: Gradually Appeared Comorbid History: Hypertension, Peripheral Venous Disease Date Acquired: 04/30/2017 Weeks Of Treatment: 0 Clustered Wound: No Photos Wound Measurements Length: (cm) 2.5 Width: (cm) 2.7 Depth: (cm) 1 Area: (cm) 5.301 Volume: (cm) 5.301 % Reduction in Area: 0% % Reduction in Volume: 0% Epithelialization: None Tunneling: No Undermining: Yes Starting Position (o'clock): 10 Ending Position  (o'clock): 3 Maximum Distance: (cm) 2 Wound Description Classification: Full Thickness Without Exposed Support Structures Wound Margin: Flat and Intact Exudate Amount: Medium Exudate Type: Serosanguineous Exudate Color: red, brown Foul Odor After Cleansing: No Slough/Fibrino Yes Wound Bed Granulation Amount: Small (1-33%) Exposed Structure Granulation Quality: Red, Pink Fascia Exposed: No Necrotic Amount: Large (67-100%) Fat Layer (Subcutaneous Tissue) Exposed: Yes Necrotic Quality: Adherent Slough Tendon Exposed: No Muscle Exposed: No Joint Exposed: No Bone Exposed: No Electronic Signature(s) Signed: 05/26/2020 11:33:31 AM By: Carlene Coria RN Entered By: Carlene Coria on 05/25/2020 13:58:41 Adamson, [name] ([id number]) -------------------------------------------------------------------------------- Vitals Details Patient Name: [name]. Date of Service: 05/25/2020 1:00 PM Medical Record Number: [id number] Patient Account Number: [id number] Date of Birth/Sex: 1927-07-16 (87 y.o. F) Treating RN: Carlene Coria Primary Care Alianna Wurster: Durenda Guthrie Other Clinician: Referring Dajion Bickford: Durenda Guthrie Treating Ankush Gintz/Extender: Tito Dine in Treatment: 0 Vital Signs Time Taken: 13:24 Temperature (F): 98.6 Height (in): 62 Pulse (bpm): 76 Source: Stated Respiratory Rate (breaths/min): 18 Weight (lbs): 140 Blood Pressure (mmHg): 145/74 Body Mass Index (BMI): 25.6 Reference Range: 80 - 120 mg / dl Electronic Signature(s) Signed: 05/26/2020 11:33:31 AM By: Carlene Coria RN Entered By: Carlene Coria on 05/25/2020 13:24:45

## 2020-05-26 NOTE — Progress Notes (Signed)
EMA, SINES ([id number]) Visit Report for 05/25/2020 Chief Complaint Document Details Patient Name: [name], [name]. Date of Service: 05/25/2020 1:00 PM Medical Record Number: [id number] Patient Account Number: [id number] Date of Birth/Sex: Jul 21, 1927 (87 y.o. F) Treating RN: Cornell Barman Primary Care Provider: Durenda Guthrie Other Clinician: Referring Provider: Durenda Guthrie Treating Provider/Extender: Tito Dine in Treatment: 0 Information Obtained from: Patient Chief Complaint 05/25/20; patient is here for review of an area over the right lateral knee over the fibular head Electronic Signature(s) Signed: 05/26/2020 11:56:06 AM By: Linton Ham MD Entered By: Linton Ham on 05/25/2020 14:21:52 [name], [name] ([id number]) -------------------------------------------------------------------------------- HPI Details Patient Name: [name]. Date of Service: 05/25/2020 1:00 PM Medical Record Number: [id number] Patient Account Number: [id number] Date of Birth/Sex: March 12, 1928 (87 y.o. F) Treating RN: Cornell Barman Primary Care Provider: Durenda Guthrie Other Clinician: Referring Provider: Durenda Guthrie Treating Provider/Extender: Tito Dine in Treatment: 0 History of Present Illness HPI Description: ADMISSION 05/25/2020 This is a 87 year old veryry disabled woman who arrives accompanied by her daughter. She has Alzheimer's disease as well had a intracerebral hemorrhage in 2017 affecting her left hemisphere. [facility] According to her daughter she has had an area over her fibular head on the right for as long as 2 years. This initially started is an expanding red spot however it is not gotten any better and recently when the patient's daughter saw it she was concerned about how much larger and deeper it was. [facility] I think most recently using silver collagen. She also had at one  time [facility]. The patient was apparently sent to [facility] at 1 point I am not sure who she saw and I do not see that in care everywhere. Apparently they have fashioned a foam wedge to keep her area off the mattress in her bed. She does not seem to be in a lot of pain until the area is manipulated. Past medical history includes intercerebral hemorrhage in the left hemisphere 2017, Alzheimer's disease, hypertension, adrenal nodule, liver nodule ABI in the right leg in our clinic was 0.41 Electronic Signature(s) Signed: 05/26/2020 11:56:06 AM By: Linton Ham MD Entered By: Linton Ham on 05/25/2020 14:24:49 [name], [name] ([id number]) -------------------------------------------------------------------------------- Physical Exam Details Patient Name: [name]. Date of Service: 05/25/2020 1:00 PM Medical Record Number: [id number] Patient Account Number: [id number] Date of Birth/Sex: 22-May-1927 (87 y.o. F) Treating RN: Cornell Barman Primary Care Provider: Durenda Guthrie Other Clinician: Referring Provider: Durenda Guthrie Treating Provider/Extender: Tito Dine in Treatment: 0 Constitutional Sitting or standing Blood Pressure is within target range for patient.. Pulse regular and within target range for patient.Marland Kitchen Respirations regular, non- labored and within target range.. Temperature is normal and within the target range for the patient.Marland Kitchen appears in no distress. Respiratory Respiratory effort is easy and symmetric bilaterally. Rate is normal at rest and on room air.. Musculoskeletal Contractured. Psychiatric Advanced dementia. Notes Wound exam; the areas on the right fibular head. This is a deep punched out area there is undermining from 11-3. At 1 point this goes to the fibular head itself. There is no surrounding erythema no purulence. It is tender when manipulated but otherwise she does not seem to be too bothered by it. Electronic Signature(s) Signed:  05/26/2020 11:56:06 AM By: Linton Ham MD Entered By: Linton Ham on 05/25/2020 14:40:02 [name], [name] ([id number]) -------------------------------------------------------------------------------- Physician Orders Details Patient Name:  [name], Marie M. Date of Service: 05/25/2020 1:00 PM Medical Record Number: [id number] Patient Account Number: [id number] Date of Birth/Sex: 1928-04-26 (87 y.o. F) Treating RN: Cornell Barman Primary Care Provider: Durenda Guthrie Other Clinician: Referring Provider: Durenda Guthrie Treating Provider/Extender: Tito Dine in Treatment: 0 Verbal / Phone Orders: No Diagnosis Coding Follow-up Appointments o Return Appointment in 1 week. Home Health Wound #1 [place] for wound care. May utilize formulary equivalent dressing for wound treatment orders unless otherwise specified. Home Health Nurse may visit PRN to address patientos wound care needs. - [facility] Bathing/ Shower/ Hygiene o May shower; gently cleanse wound with antibacterial soap, rinse and pat dry prior to dressing wounds Anesthetic (Use 'Patient Medications' Section for Anesthetic Order Entry) Wound #1 Right,Lateral Knee o Lidocaine applied to wound bed Wound Treatment Wound #1 - Knee Wound Laterality: Right, Lateral Primary Dressing: Prisma 4.34 (in) 3 x Per Week/30 Days Discharge Instructions: Moisten w/normal saline or sterile water; Cover wound as directed. Do not remove from wound bed. Secondary Dressing: Mepilex Border Flex, 4x4 (in/in) 3 x Per Week/30 Days Discharge Instructions: Apply to wound as directed. Do not cut. Radiology o X-ray, knee - Right lateral knee, non-healing wound. Electronic Signature(s) Signed: 05/25/2020 5:40:58 PM By: Gretta Cool, BSN, RN, CWS, Kim RN, BSN Signed: 05/26/2020 11:56:06 AM By: Linton Ham MD Entered By: Gretta Cool, BSN, RN, CWS, Kim on 05/25/2020 14:22:23 [name], [name]  ([id number]) -------------------------------------------------------------------------------- Problem List Details Patient Name: [name], [name]. Date of Service: 05/25/2020 1:00 PM Medical Record Number: [id number] Patient Account Number: [id number] Date of Birth/Sex: 12-Sep-1927 (87 y.o. 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 Date of Service: 05/25/2020 1:00 PM Medical Record Number: [id number] Patient Account Number: [id number] Date of Birth/Sex: Oct 20, 1927 (87 y.o. F) Treating RN: Cornell Barman Primary Care Provider: Durenda Guthrie Other Clinician: Referring Provider: Durenda Guthrie Treating Provider/Extender: Tito Dine in Treatment: 0 Subjective Chief Complaint Information obtained from Patient 05/25/20; patient is here for review of an area over the right lateral knee over the fibular head History of Present Illness (HPI) ADMISSION 05/25/2020 This is a 87 year old very disabled woman who arrives accompanied by her daughter. She has Alzheimer's disease as well had a intracerebral hemorrhage in 2017 affecting her left hemisphere. [facility] a [facility] According to her daughter she has had an  area over her fibular head on the right for as long as 2 years. This initially started is an expanding red spot however it is not gotten any better and recently when the patient's daughter saw it she was concerned about how much larger and deeper it was. [facility] I think most recently using silver collagen. She also had at one time [facility]. The patient was apparently sent to [facility] at 1 point I am not sure who she saw and I do not see that  in care everywhere. Apparently they have fashioned a foam wedge to keep her area off the mattress in her bed. She does not seem to be in a lot of pain until the area is manipulated. Past medical history includes intercerebral hemorrhage in the left hemisphere 2017, Alzheimer's disease, hypertension, adrenal nodule, liver nodule ABI in the right leg in our clinic was 0.41 Patient History Information obtained from Patient. Allergies No Known Allergies Social History Former smoker, Marital Status - Widowed, Alcohol Use - Never, Drug Use - No History, Caffeine Use - Never. Medical History Eyes Denies history of Cataracts, Glaucoma, Optic Neuritis Ear/Nose/Mouth/Throat Denies history of Chronic sinus problems/congestion, Middle ear problems Hematologic/Lymphatic Denies history of Anemia, Hemophilia, Human Immunodeficiency Virus, Lymphedema, Sickle Cell Disease Respiratory Denies history of Aspiration, Asthma, Chronic Obstructive Pulmonary Disease (COPD), Pneumothorax, Sleep Apnea, Tuberculosis Cardiovascular Patient has history of Hypertension, Peripheral Venous Disease Denies history of Angina, Arrhythmia, Congestive Heart Failure, Coronary Artery Disease, Deep Vein Thrombosis, Hypotension, Myocardial Infarction, Peripheral Arterial Disease, Phlebitis, Vasculitis Gastrointestinal Denies history of Cirrhosis , Colitis, Crohn s, Hepatitis A, Hepatitis B, Hepatitis C Endocrine Denies history of Type I Diabetes,  Type II Diabetes Genitourinary Denies history of End Stage Renal Disease Immunological Denies history of Lupus Erythematosus, Raynaud s, Scleroderma Integumentary (Skin) Denies history of History of Burn, History of pressure wounds Musculoskeletal Denies history of Gout, Rheumatoid Arthritis, Osteoarthritis, Osteomyelitis Neurologic Denies history of Dementia, Neuropathy, Quadriplegia, Paraplegia, Seizure Disorder Oncologic Denies history of Received Chemotherapy, Received Radiation Psychiatric Denies history of Anorexia/bulimia, Confinement Anxiety Review of Systems (ROS) [name], Marie M. ([id number]) Constitutional Symptoms (General Health) Denies complaints or symptoms of Fatigue, Fever, Chills, Marked Weight Change. Eyes Denies complaints or symptoms of Dry Eyes, Vision Changes, Glasses / Contacts. Ear/Nose/Mouth/Throat Denies complaints or symptoms of Difficult clearing ears, Sinusitis. Hematologic/Lymphatic Denies complaints or symptoms of Bleeding / Clotting Disorders, Human Immunodeficiency Virus. Respiratory Denies complaints or symptoms of Chronic or frequent coughs, Shortness of Breath. Cardiovascular Denies complaints or symptoms of Chest pain, LE edema. Gastrointestinal Denies complaints or symptoms of Frequent diarrhea, Nausea, Vomiting. Endocrine Denies complaints or symptoms of Hepatitis, Thyroid disease, Polydypsia (Excessive Thirst). Genitourinary Denies complaints or symptoms of Kidney failure/ Dialysis, Incontinence/dribbling. Immunological Denies complaints or symptoms of Hives, Itching. Integumentary (Skin) Complains or has symptoms of Wounds, Swelling. Denies complaints or symptoms of Bleeding or bruising tendency, Breakdown. Musculoskeletal Denies complaints or symptoms of Muscle Pain, Muscle Weakness. Neurologic Denies complaints or symptoms of Numbness/parasthesias, Focal/Weakness. Psychiatric Denies complaints or symptoms of Anxiety,  Claustrophobia. Objective Constitutional Sitting or standing Blood Pressure is within target range for patient.. Pulse regular and within target range for patient.Marland Kitchen Respirations regular, non- labored and within target range.. Temperature is normal and within the target range for the patient.Marland Kitchen appears in no distress. Vitals Time Taken: 1:24 PM, Height: 62 in, Source: Stated, Weight: 140 lbs, BMI: 25.6, Temperature: 98.6 F, Pulse: 76 bpm, Respiratory Rate: 18 breaths/min, Blood Pressure: 145/74 mmHg. Respiratory Respiratory effort is easy and symmetric bilaterally. Rate is normal at rest and on room air.. Musculoskeletal Contractured. Psychiatric Advanced dementia. General Notes: Wound exam; the areas on the right fibular head. This is a deep punched out area there is undermining from 11-3. At 1 point this goes to the fibular head itself. There is no surrounding erythema no purulence. It is tender when manipulated but otherwise she does not seem to be too bothered by it. Integumentary (Hair, Skin) Wound #1 status is Open. Original cause of wound was Gradually Appeared. The wound  is located on the Right,Lateral Knee. The wound measures 2.5cm length x 2.7cm width x 1cm depth; 5.301cm^2 area and 5.301cm^3 volume. There is Fat Layer (Subcutaneous Tissue) exposed. There is no tunneling noted, however, there is undermining starting at 10:00 and ending at 3:00 with a maximum distance of 2cm. There is a medium amount of serosanguineous drainage noted. The wound margin is flat and intact. There is small (1-33%) red, pink granulation within the wound bed. There is a large (67-100%) amount of necrotic tissue within the wound bed including Adherent Slough. Assessment Active Problems ICD-10 [name], [name]. ([id number]) Non-pressure chronic ulcer of skin of other sites with other specified severity Other Alzheimer's disease Immobility syndrome (paraplegic) Plan Follow-up Appointments: Return  Appointment in 1 week. Home Health: Wound #1 Right,Lateral Knee: [place] for wound care. May utilize formulary equivalent dressing for wound treatment orders unless otherwise specified. Home Health Nurse may visit PRN to address patient s wound care needs. - [facility] Bathing/ Shower/ Hygiene: May shower; gently cleanse wound with antibacterial soap, rinse and pat dry prior to dressing wounds Anesthetic (Use 'Patient Medications' Section for Anesthetic Order Entry): Wound #1 Right,Lateral Knee: Lidocaine applied to wound bed Radiology ordered were: X-ray, knee - Right lateral knee, non-healing wound. WOUND #1: - Knee Wound Laterality: Right, Lateral Primary Dressing: Prisma 4.34 (in) 3 x Per Week/30 Days Discharge Instructions: Moisten w/normal saline or sterile water; Cover wound as directed. Do not remove from wound bed. Secondary Dressing: Mepilex Border Flex, 4x4 (in/in) 3 x Per Week/30 Days Discharge Instructions: Apply to wound as directed. Do not cut. 1. For now I agree with the silver collagen-based dressings that they have been doing at the [facility] already 2. Presumably a pressure ulcer at one point although I do not have a lot of information on this. The patient's daughter did not have any pictures on her phone. 3. My thoughts about the nonhealing of this include underlying osteomyelitis of the fibular head, absence of adequate blood flow which seems supported by her very low ABI in the absence of a popliteal pulse. I think we can start with a plain x-ray of the knee looking at the fibular head specifically. It would be possible to do a bone biopsy and culture. I cannot see getting this patient through an advanced imaging process like a MRI or CT scan. 4. I think it would also be possible to get a specimen of bone although this would be aggressive and painful. 5. I talked to the patient's daughter about the ethics of aggressive testing in somebody like  her mother. I am not completely certain she understood me in the way it was intended. The severity of dementia all was should possibly influence what we do or do not do in a particular clinical situation. It would not be unreasonable to do very little in terms of testing but attempt to keep the wound clean and uninfected. Prisma would be a good choice for this if this is the route she chose to go I spent 35 minutes in review of this patient's past medical history, face-to-face evaluation and preparation of this record Electronic Signature(s) Signed: 05/26/2020 11:56:06 AM By: Linton Ham MD Entered By: Linton Ham on 05/25/2020 14:43:26 [name], [name] ([id number]) -------------------------------------------------------------------------------- ROS/PFSH Details Patient Name: [name]. Date of Service: 05/25/2020 1:00 PM Medical Record Number: [id number] Patient Account Number: [id number] Date of Birth/Sex: 01-03-1928 (87 y.o. F) Treating RN: Carlene Coria Primary Care Provider: Durenda Guthrie  Other Clinician: Referring Provider: Durenda Guthrie Treating Provider/Extender: Tito Dine in Treatment: 0 Information Obtained From Patient Constitutional Symptoms (General Health) Complaints and Symptoms: Negative for: Fatigue; Fever; Chills; Marked Weight Change Eyes Complaints and Symptoms: Negative for: Dry Eyes; Vision Changes; Glasses / Contacts Medical History: Negative for: Cataracts; Glaucoma; Optic Neuritis Ear/Nose/Mouth/Throat Complaints and Symptoms: Negative for: Difficult clearing ears; Sinusitis Medical History: Negative for: Chronic sinus problems/congestion; Middle ear problems Hematologic/Lymphatic Complaints and Symptoms: Negative for: Bleeding / Clotting Disorders; Human Immunodeficiency Virus Medical History: Negative for: Anemia; Hemophilia; Human Immunodeficiency Virus; Lymphedema; Sickle Cell Disease Respiratory Complaints and  Symptoms: Negative for: Chronic or frequent coughs; Shortness of Breath Medical History: Negative for: Aspiration; Asthma; Chronic Obstructive Pulmonary Disease (COPD); Pneumothorax; Sleep Apnea; Tuberculosis Cardiovascular Complaints and Symptoms: Negative for: Chest pain; LE edema Medical History: Positive for: Hypertension; Peripheral Venous Disease Negative for: Angina; Arrhythmia; Congestive Heart Failure; Coronary Artery Disease; Deep Vein Thrombosis; Hypotension; Myocardial Infarction; Peripheral Arterial Disease; Phlebitis; Vasculitis Gastrointestinal Complaints and Symptoms: Negative for: Frequent diarrhea; Nausea; Vomiting Medical History: Negative for: Cirrhosis ; Colitis; Crohnos; Hepatitis A; Hepatitis B; Hepatitis C Endocrine [name], Marie M. ([id number]) Complaints and Symptoms: Negative for: Hepatitis; Thyroid disease; Polydypsia (Excessive Thirst) Medical History: Negative for: Type I Diabetes; Type II Diabetes Genitourinary Complaints and Symptoms: Negative for: Kidney failure/ Dialysis; Incontinence/dribbling Medical History: Negative for: End Stage Renal Disease Immunological Complaints and Symptoms: Negative for: Hives; Itching Medical History: Negative for: Lupus Erythematosus; Raynaudos; Scleroderma Integumentary (Skin) Complaints and Symptoms: Positive for: Wounds; Swelling Negative for: Bleeding or bruising tendency; Breakdown Medical History: Negative for: History of Burn; History of pressure wounds Musculoskeletal Complaints and Symptoms: Negative for: Muscle Pain; Muscle Weakness Medical History: Negative for: Gout; Rheumatoid Arthritis; Osteoarthritis; Osteomyelitis Neurologic Complaints and Symptoms: Negative for: Numbness/parasthesias; Focal/Weakness Medical History: Negative for: Dementia; Neuropathy; Quadriplegia; Paraplegia; Seizure Disorder Psychiatric Complaints and Symptoms: Negative for: Anxiety; Claustrophobia Medical  History: Negative for: Anorexia/bulimia; Confinement Anxiety Oncologic Medical History: Negative for: Received Chemotherapy; Received Radiation Immunizations Pneumococcal Vaccine: Received Pneumococcal Vaccination: Yes Implantable Devices None Family and Social History [name], DECHERT ([id number]) Former smoker; Marital Status - Widowed; Alcohol Use: Never; Drug Use: No History; Caffeine Use: Never; Financial Concerns: No; Food, Clothing or Shelter Needs: No; Support System Lacking: No; Transportation Concerns: No Electronic Signature(s) Signed: 05/26/2020 11:33:31 AM By: Carlene Coria RN Signed: 05/26/2020 11:56:06 AM By: Linton Ham MD Entered By: Carlene Coria on 05/25/2020 13:46:59 [name], [name] ([id number]) -------------------------------------------------------------------------------- SuperBill Details Patient Name: [name]. Date of Service: 05/25/2020 Medical Record Number: [id number] Patient Account Number: [id number] Date of Birth/Sex: [birth year] (87 y.o. 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

## 2020-05-26 NOTE — Progress Notes (Signed)
[name], [name] ([id number]) Visit Report for 05/25/2020 Abuse/Suicide Risk Screen Details Patient Name: [name], [name]. Date of Service: 05/25/2020 1:00 PM Medical Record Number: [id number] Patient Account Number: [id number] Date of Birth/Sex: [birth year] (87 y.o. F) Treating RN: Carlene Coria Primary Care Adjoa Althouse: Durenda Guthrie Other Clinician: Referring Mizraim Harmening: Durenda Guthrie Treating Quint Chestnut/Extender: Tito Dine in Treatment: 0 Abuse/Suicide Risk Screen Items Answer ABUSE RISK SCREEN: Has anyone close to you tried to hurt or harm you recentlyo No Do you feel uncomfortable with anyone in your familyo No Has anyone forced you do things that you didnot want to doo No Electronic Signature(s) Signed: 05/26/2020 11:33:31 AM By: Carlene Coria RN Entered By: Carlene Coria on 05/25/2020 13:47:07 Wiegert, Verl Dicker ([id number]) -------------------------------------------------------------------------------- Activities of Daily Living Details Patient Name: [name], [name]. Date of Service: 05/25/2020 1:00 PM Medical Record Number: [id number] Patient Account Number: [id number] Date of Birth/Sex: [birth year] (87 y.o. F) Treating RN: Carlene Coria Primary Care Johnell Landowski: Durenda Guthrie Other Clinician: Referring Remus Hagedorn: Durenda Guthrie Treating Aashvi Rezabek/Extender: Tito Dine in Treatment: 0 Activities of Daily Living Items Answer Activities of Daily Living (Please select one for each item) Drive Automobile Not Able Take Medications Not Able Use Telephone Need Assistance Care for Appearance Not Able Use Toilet Not Able Bath / Shower Not Able Dress Self Not Able Feed Self Not Able Walk Not Able Get In / Out Bed Not Able Housework Not Able Prepare Meals Not Able Handle Money Not Able Shop for Self Not Able Electronic Signature(s) Signed: 05/26/2020 11:33:31 AM By: Carlene Coria RN Entered By: Carlene Coria on 05/25/2020 13:47:37 Ocallaghan, Verl Dicker  ([id number]) -------------------------------------------------------------------------------- Education Screening Details Patient Name: [name]. Date of Service: 05/25/2020 1:00 PM Medical Record Number: [id number] Patient Account Number: [id number] Date of Birth/Sex: 1928-01-04 (87 y.o. F) Treating RN: Carlene Coria Primary Care Zaida Reiland: Durenda Guthrie Other Clinician: Referring Alacia Rehmann: Durenda Guthrie Treating Jules Baty/Extender: Tito Dine in Treatment: 0 Primary Learner Assessed: Patient Learning Preferences/Education Level/Primary Language Learning Preference: Explanation Highest Education Level: High School Preferred Language: English Cognitive Barrier Language Barrier: No Translator Needed: No Memory Deficit: No Emotional Barrier: No Physical Barrier Impaired Vision: No Impaired Hearing: No Decreased Hand dexterity: No Knowledge/Comprehension Knowledge Level: Medium Comprehension Level: Medium Ability to understand written instructions: High Ability to understand verbal instructions: High Motivation Anxiety Level: Anxious Cooperation: Cooperative Education Importance: Acknowledges Need Interest in Health Problems: Asks Questions Perception: Coherent Willingness to Engage in Self-Management High Activities: Readiness to Engage in Self-Management High Activities: Electronic Signature(s) Signed: 05/26/2020 11:33:31 AM By: Carlene Coria RN Entered By: Carlene Coria on 05/25/2020 13:48:03 Mcgourty, Verl Dicker ([id number]) -------------------------------------------------------------------------------- Fall Risk Assessment Details Patient Name: [name]. Date of Service: 05/25/2020 1:00 PM Medical Record Number: [id number] Patient Account Number: [id number] Date of Birth/Sex: Sep 13, 1927 (87 y.o. F) Treating RN: Carlene Coria Primary Care Makala Fetterolf: Durenda Guthrie Other Clinician: Referring Arlina Sabina: Durenda Guthrie Treating Glyn Zendejas/Extender: Tito Dine in Treatment: 0 Fall Risk Assessment Items Have you had 2 or more falls in the last 12 monthso 0 No Have you had any fall that resulted in injury in the last 12 monthso 0 No FALLS RISK SCREEN History of falling - immediate or within 3 months 0 No Secondary diagnosis (Do you have 2 or more medical diagnoseso) 0 No Ambulatory aid None/bed rest/wheelchair/nurse 0 No Crutches/cane/walker 0 No Furniture 0 No Intravenous therapy Access/Saline/Heparin Lock 0 No Gait/Transferring Normal/ bed rest/ wheelchair 0 No Weak (short steps with or without shuffle,  stooped but able to lift head while walking, may 0 No seek support from furniture) Impaired (short steps with shuffle, may have difficulty arising from chair, head down, impaired 0 No balance) Mental Status Oriented to own ability 0 No Electronic Signature(s) Signed: 05/26/2020 11:33:31 AM By: Carlene Coria RN Entered By: Carlene Coria on 05/25/2020 13:48:13 Maddux, Verl Dicker ([id number]) -------------------------------------------------------------------------------- Foot Assessment Details Patient Name: [name]. Date of Service: 05/25/2020 1:00 PM Medical Record Number: [id number] Patient Account Number: [id number] Date of Birth/Sex: [birth year] (87 y.o. F) Treating RN: Carlene Coria Primary Care Ingrid Shifrin: Durenda Guthrie Other Clinician: Referring Nekia Maxham: Durenda Guthrie Treating Kaelon Weekes/Extender: Tito Dine in Treatment: 0 Foot Assessment Items [x]  Unable to perform due to altered mental status Site Locations + = Sensation present, - = Sensation absent, C = Callus, U = Ulcer R = Redness, W = Warmth, M = Maceration, PU = Pre-ulcerative lesion F = Fissure, S = Swelling, D = Dryness Assessment Right: Left: Other Deformity: No No Prior Foot Ulcer: No No Prior Amputation: No No Charcot Joint: No No Ambulatory Status: Gait: Electronic Signature(s) Signed: 05/26/2020 11:33:31 AM By: Carlene Coria  RN Entered By: Carlene Coria on 05/25/2020 13:48:55 Westrup, Verl Dicker ([id number]) -------------------------------------------------------------------------------- Nutrition Risk Screening Details Patient Name: [name]. Date of Service: 05/25/2020 1:00 PM Medical Record Number: [id number] Patient Account Number: [id number] Date of Birth/Sex: 12-27-1927 (87 y.o. F) Treating RN: Carlene Coria Primary Care Elzora Cullins: Durenda Guthrie Other Clinician: Referring Neve Branscomb: Durenda Guthrie Treating Loletha Bertini/Extender: Tito Dine in Treatment: 0 Height (in): 62 Weight (lbs): 140 Body Mass Index (BMI): 25.6 Nutrition Risk Screening Items Score Screening NUTRITION RISK SCREEN: I have an illness or condition that made me change the kind and/or amount of food I eat 0 No I eat fewer than two meals per day 0 No I eat few fruits and vegetables, or milk products 0 No I have three or more drinks of beer, liquor or wine almost every day 0 No I have tooth or mouth problems that make it hard for me to eat 0 No I don't always have enough money to buy the food I need 0 No I eat alone most of the time 0 No I take three or more different prescribed or over-the-counter drugs a day 1 Yes Without wanting to, I have lost or gained 10 pounds in the last six months 0 No I am not always physically able to shop, cook and/or feed myself 2 Yes Nutrition Protocols Good Risk Protocol 0 No interventions needed Moderate Risk Protocol 0 Provide education on nutrition High Risk Proctocol Risk Level: Moderate Risk Score: 3 Electronic Signature(s) Signed: 05/26/2020 11:33:31 AM By: Carlene Coria RN Entered By: Carlene Coria on 05/25/2020 13:48:44

## 2020-06-01 ENCOUNTER — Ambulatory Visit: Payer: Medicare (Managed Care) | Admitting: Internal Medicine

## 2020-11-03 ENCOUNTER — Inpatient Hospital Stay: Payer: Medicare (Managed Care)

## 2020-11-03 ENCOUNTER — Emergency Department: Payer: Medicare (Managed Care)

## 2020-11-03 ENCOUNTER — Encounter: Payer: Self-pay | Admitting: Emergency Medicine

## 2020-11-03 ENCOUNTER — Inpatient Hospital Stay
Admission: EM | Admit: 2020-11-03 | Discharge: 2020-11-09 | DRG: 872 | Disposition: A | Discharge status: Home / Self Care | Payer: Medicare (Managed Care) | Attending: Internal Medicine | Admitting: Internal Medicine

## 2020-11-03 ENCOUNTER — Other Ambulatory Visit: Payer: Self-pay

## 2020-11-03 DIAGNOSIS — A419 Sepsis, unspecified organism: Secondary | ICD-10-CM | POA: Diagnosis present

## 2020-11-03 DIAGNOSIS — K56600 Partial intestinal obstruction, unspecified as to cause: Secondary | ICD-10-CM

## 2020-11-03 DIAGNOSIS — F0280 Dementia in other diseases classified elsewhere without behavioral disturbance: Secondary | ICD-10-CM | POA: Diagnosis present

## 2020-11-03 DIAGNOSIS — E876 Hypokalemia: Secondary | ICD-10-CM | POA: Diagnosis present

## 2020-11-03 DIAGNOSIS — R000 Tachycardia, unspecified: Secondary | ICD-10-CM | POA: Diagnosis present

## 2020-11-03 DIAGNOSIS — Z79899 Other long term (current) drug therapy: Secondary | ICD-10-CM | POA: Diagnosis not present

## 2020-11-03 DIAGNOSIS — Z20822 Contact with and (suspected) exposure to covid-19: Secondary | ICD-10-CM | POA: Diagnosis present

## 2020-11-03 DIAGNOSIS — I129 Hypertensive chronic kidney disease with stage 1 through stage 4 chronic kidney disease, or unspecified chronic kidney disease: Secondary | ICD-10-CM | POA: Diagnosis present

## 2020-11-03 DIAGNOSIS — I2510 Atherosclerotic heart disease of native coronary artery without angina pectoris: Secondary | ICD-10-CM | POA: Diagnosis present

## 2020-11-03 DIAGNOSIS — Z9889 Other specified postprocedural states: Secondary | ICD-10-CM

## 2020-11-03 DIAGNOSIS — R0600 Dyspnea, unspecified: Secondary | ICD-10-CM

## 2020-11-03 DIAGNOSIS — R140 Abdominal distension (gaseous): Secondary | ICD-10-CM

## 2020-11-03 DIAGNOSIS — K5900 Constipation, unspecified: Secondary | ICD-10-CM | POA: Diagnosis present

## 2020-11-03 DIAGNOSIS — G309 Alzheimer's disease, unspecified: Secondary | ICD-10-CM | POA: Diagnosis present

## 2020-11-03 DIAGNOSIS — I700 Atherosclerosis of aorta: Secondary | ICD-10-CM | POA: Diagnosis present

## 2020-11-03 DIAGNOSIS — R059 Cough, unspecified: Secondary | ICD-10-CM

## 2020-11-03 DIAGNOSIS — Z8673 Personal history of transient ischemic attack (TIA), and cerebral infarction without residual deficits: Secondary | ICD-10-CM | POA: Diagnosis not present

## 2020-11-03 DIAGNOSIS — I739 Peripheral vascular disease, unspecified: Secondary | ICD-10-CM | POA: Diagnosis present

## 2020-11-03 DIAGNOSIS — F0390 Unspecified dementia without behavioral disturbance: Secondary | ICD-10-CM | POA: Diagnosis not present

## 2020-11-03 DIAGNOSIS — B961 Klebsiella pneumoniae [K. pneumoniae] as the cause of diseases classified elsewhere: Secondary | ICD-10-CM | POA: Diagnosis present

## 2020-11-03 DIAGNOSIS — Z85038 Personal history of other malignant neoplasm of large intestine: Secondary | ICD-10-CM | POA: Diagnosis not present

## 2020-11-03 DIAGNOSIS — Z66 Do not resuscitate: Secondary | ICD-10-CM | POA: Diagnosis present

## 2020-11-03 DIAGNOSIS — N183 Chronic kidney disease, stage 3 unspecified: Secondary | ICD-10-CM | POA: Diagnosis present

## 2020-11-03 DIAGNOSIS — I10 Essential (primary) hypertension: Secondary | ICD-10-CM

## 2020-11-03 DIAGNOSIS — K56609 Unspecified intestinal obstruction, unspecified as to partial versus complete obstruction: Secondary | ICD-10-CM | POA: Diagnosis not present

## 2020-11-03 DIAGNOSIS — K3184 Gastroparesis: Secondary | ICD-10-CM | POA: Diagnosis present

## 2020-11-03 DIAGNOSIS — R0609 Other forms of dyspnea: Secondary | ICD-10-CM

## 2020-11-03 DIAGNOSIS — J90 Pleural effusion, not elsewhere classified: Secondary | ICD-10-CM | POA: Diagnosis present

## 2020-11-03 DIAGNOSIS — N390 Urinary tract infection, site not specified: Secondary | ICD-10-CM | POA: Diagnosis present

## 2020-11-03 DIAGNOSIS — Z0189 Encounter for other specified special examinations: Secondary | ICD-10-CM

## 2020-11-03 DIAGNOSIS — D649 Anemia, unspecified: Secondary | ICD-10-CM | POA: Diagnosis present

## 2020-11-03 LAB — PROTIME-INR
INR: 1.1 (ref 0.8–1.2)
Prothrombin Time: 14.1 seconds (ref 11.4–15.2)

## 2020-11-03 LAB — CBC WITH DIFFERENTIAL/PLATELET
Abs Immature Granulocytes: 0.05 10*3/uL (ref 0.00–0.07)
Basophils Absolute: 0 10*3/uL (ref 0.0–0.1)
Basophils Relative: 0 %
Eosinophils Absolute: 0 10*3/uL (ref 0.0–0.5)
Eosinophils Relative: 0 %
HCT: 40.2 % (ref 36.0–46.0)
Hemoglobin: 12.7 g/dL (ref 12.0–15.0)
Immature Granulocytes: 0 %
Lymphocytes Relative: 6 %
Lymphs Abs: 0.7 10*3/uL (ref 0.7–4.0)
MCH: 28.7 pg (ref 26.0–34.0)
MCHC: 31.6 g/dL (ref 30.0–36.0)
MCV: 90.7 fL (ref 80.0–100.0)
Monocytes Absolute: 0.7 10*3/uL (ref 0.1–1.0)
Monocytes Relative: 6 %
Neutro Abs: 9.9 10*3/uL — ABNORMAL HIGH (ref 1.7–7.7)
Neutrophils Relative %: 88 %
Platelets: 244 10*3/uL (ref 150–400)
RBC: 4.43 MIL/uL (ref 3.87–5.11)
RDW: 13.7 % (ref 11.5–15.5)
WBC: 11.4 10*3/uL — ABNORMAL HIGH (ref 4.0–10.5)
nRBC: 0 % (ref 0.0–0.2)

## 2020-11-03 LAB — URINALYSIS, COMPLETE (UACMP) WITH MICROSCOPIC
Bilirubin Urine: NEGATIVE
Glucose, UA: 150 mg/dL — AB
Ketones, ur: NEGATIVE mg/dL
Nitrite: POSITIVE — AB
Protein, ur: NEGATIVE mg/dL
Specific Gravity, Urine: >1.046 — ABNORMAL HIGH (ref 1.005–1.030)
pH: 7 (ref 5.0–8.0)

## 2020-11-03 LAB — TROPONIN I (HIGH SENSITIVITY)
Troponin I (High Sensitivity): 15 ng/L (ref <18)
Troponin I (High Sensitivity): 24 ng/L — ABNORMAL HIGH (ref <18)

## 2020-11-03 LAB — COMPREHENSIVE METABOLIC PANEL
ALT: 28 U/L (ref 0–44)
AST: 41 U/L (ref 15–41)
Albumin: 3 g/dL — ABNORMAL LOW (ref 3.5–5.0)
Alkaline Phosphatase: 102 U/L (ref 38–126)
Anion gap: 8 (ref 5–15)
BUN: 20 mg/dL (ref 8–23)
CO2: 34 mmol/L — ABNORMAL HIGH (ref 22–32)
Calcium: 8 mg/dL — ABNORMAL LOW (ref 8.9–10.3)
Chloride: 103 mmol/L (ref 98–111)
Creatinine, Ser: 0.76 mg/dL (ref 0.44–1.00)
GFR, Estimated: >60 mL/min (ref >60)
Glucose, Bld: 199 mg/dL — ABNORMAL HIGH (ref 70–99)
Potassium: 2.7 mmol/L — CL (ref 3.5–5.1)
Sodium: 145 mmol/L (ref 135–145)
Total Bilirubin: 1 mg/dL (ref 0.3–1.2)
Total Protein: 6.4 g/dL — ABNORMAL LOW (ref 6.5–8.1)

## 2020-11-03 LAB — RESP PANEL BY RT-PCR (FLU A&B, COVID) ARPGX2
Influenza A by PCR: NEGATIVE
Influenza B by PCR: NEGATIVE
SARS Coronavirus 2 by RT PCR: NEGATIVE

## 2020-11-03 LAB — LIPASE, BLOOD: Lipase: 25 U/L (ref 11–51)

## 2020-11-03 LAB — MAGNESIUM: Magnesium: 2.1 mg/dL (ref 1.7–2.4)

## 2020-11-03 LAB — LACTIC ACID, PLASMA: Lactic Acid, Venous: 1.8 mmol/L (ref 0.5–1.9)

## 2020-11-03 LAB — APTT: aPTT: 26 seconds (ref 24–36)

## 2020-11-03 MED ORDER — TRAZODONE HCL 50 MG PO TABS: 25.0000 mg | ORAL_TABLET | Freq: Every evening | ORAL | Status: DC | PRN

## 2020-11-03 MED ORDER — ACETAMINOPHEN 650 MG RE SUPP: 650.0000 mg | Freq: Four times a day (QID) | RECTAL | Status: DC | PRN

## 2020-11-03 MED ORDER — ONDANSETRON HCL 4 MG/2ML IJ SOLN
4.0000 mg | Freq: Four times a day (QID) | INTRAMUSCULAR | Status: DC | PRN
  Administered 2020-11-06: 4 mg via INTRAVENOUS

## 2020-11-03 MED ORDER — ENOXAPARIN SODIUM 40 MG/0.4ML IJ SOSY
40.0000 mg | PREFILLED_SYRINGE | INTRAMUSCULAR | Status: DC
  Administered 2020-11-03 – 2020-11-08 (×6): 40 mg via SUBCUTANEOUS
  Filled 2020-11-03 (×6): qty 0.4

## 2020-11-03 MED ORDER — SENNOSIDES-DOCUSATE SODIUM 8.6-50 MG PO TABS
1.0000 | ORAL_TABLET | Freq: Two times a day (BID) | ORAL | Status: DC
  Administered 2020-11-03 – 2020-11-09 (×7): 1 via ORAL
  Filled 2020-11-03 (×9): qty 1

## 2020-11-03 MED ORDER — FAMOTIDINE 20 MG PO TABS: 20.0000 mg | ORAL_TABLET | Freq: Every day | ORAL | Status: DC

## 2020-11-03 MED ORDER — ACETAMINOPHEN 325 MG PO TABS: 650.0000 mg | ORAL_TABLET | Freq: Four times a day (QID) | ORAL | Status: DC | PRN

## 2020-11-03 MED ORDER — LISINOPRIL 10 MG PO TABS: 20.0000 mg | ORAL_TABLET | Freq: Two times a day (BID) | ORAL | Status: DC

## 2020-11-03 MED ORDER — ONDANSETRON HCL 4 MG PO TABS: 4.0000 mg | ORAL_TABLET | Freq: Four times a day (QID) | ORAL | Status: DC | PRN

## 2020-11-03 MED ORDER — LABETALOL HCL 5 MG/ML IV SOLN: 20.0000 mg | INTRAVENOUS | Status: DC | PRN

## 2020-11-03 MED ORDER — ATENOLOL 50 MG PO TABS
50.0000 mg | ORAL_TABLET | Freq: Every day | ORAL | Status: DC
  Filled 2020-11-03: qty 1

## 2020-11-03 MED ORDER — IOHEXOL 350 MG/ML SOLN
100.0000 mL | Freq: Once | INTRAVENOUS | Status: AC | PRN
  Administered 2020-11-03: 100 mL via INTRAVENOUS

## 2020-11-03 MED ORDER — MEMANTINE HCL 5 MG PO TABS: 5.0000 mg | ORAL_TABLET | Freq: Every day | ORAL | Status: DC

## 2020-11-03 MED ORDER — POTASSIUM CHLORIDE 10 MEQ/100ML IV SOLN
10.0000 meq | INTRAVENOUS | Status: AC
  Administered 2020-11-03: 10 meq via INTRAVENOUS
  Filled 2020-11-03: qty 100

## 2020-11-03 MED ORDER — MAGNESIUM HYDROXIDE 400 MG/5ML PO SUSP: 30.0000 mL | Freq: Every day | ORAL | Status: DC | PRN

## 2020-11-03 MED ORDER — POTASSIUM CHLORIDE IN NACL 40-0.9 MEQ/L-% IV SOLN
INTRAVENOUS | Status: DC
  Filled 2020-11-03 (×5): qty 1000

## 2020-11-03 MED ORDER — FELODIPINE ER 10 MG PO TB24
10.0000 mg | ORAL_TABLET | Freq: Every day | ORAL | Status: DC
  Administered 2020-11-05 – 2020-11-09 (×4): 10 mg via ORAL
  Filled 2020-11-03 (×7): qty 1

## 2020-11-03 MED ORDER — DOCUSATE SODIUM 50 MG/5ML PO LIQD: 100.0000 mg | Freq: Two times a day (BID) | ORAL | Status: DC

## 2020-11-03 MED ORDER — SODIUM CHLORIDE 0.9 % IV SOLN: Freq: Once | INTRAVENOUS | Status: AC

## 2020-11-03 MED ORDER — SODIUM CHLORIDE 0.9 % IV SOLN
1.0000 g | Freq: Every day | INTRAVENOUS | Status: DC
  Administered 2020-11-04 – 2020-11-06 (×3): 1 g via INTRAVENOUS
  Filled 2020-11-03 (×4): qty 10

## 2020-11-03 MED ORDER — POTASSIUM CHLORIDE 20 MEQ PO PACK: 40.0000 meq | PACK | Freq: Once | ORAL | Status: DC

## 2020-11-03 MED ORDER — CLONIDINE HCL 0.1 MG PO TABS: 0.2000 mg | ORAL_TABLET | Freq: Three times a day (TID) | ORAL | Status: DC

## 2020-11-03 MED ORDER — POLYETHYLENE GLYCOL 3350 17 G PO PACK: 17.0000 g | PACK | Freq: Every day | ORAL | Status: DC | PRN

## 2020-11-03 NOTE — ED Notes (Signed)
Pt resting quietly in room with eyes closed. Family no longer at bedside. Will continue to monitor. Will continue to monitor for changes. Pt has pure wick placed awaiting urine to send down for UA

## 2020-11-03 NOTE — H&P (Addendum)
[place]   PATIENT NAME: [name]    MR#:  [id number]  DATE OF BIRTH:  [birth year]  DATE OF ADMISSION:  11/03/2020  PRIMARY CARE PHYSICIAN: Gareth Morgan, MD   Patient is coming from: Home  REQUESTING/REFERRING PHYSICIAN: Nance Pear, MD  CHIEF COMPLAINT:   Chief Complaint  Patient presents with  . Nausea  . Emesis    HISTORY OF PRESENT ILLNESS:  [name] is a 87 y.o. African-American female with medical history significant for Dementia, CVA, hypertension, PVD, stage III CKD, who presented to the emergency room with acute onset of recurrent vomiting over the last few days.  She was seen by pace nurse who thought she was constipated.  The patient was given an enema with fair amount of stools however apparently continued to have symptoms.  The patient is a known historian due to her dementia.  No reported fever or chills.  No reported dysuria, urinary frequency or urgency or flank pain or hematuria.  No reported cough or wheezing or dyspnea.  No reported chest pain or palpitations.  ED Course: When she came to the ER blood pressure was 168/89 with pulse of 89 and later 93 and respiratory rate of 20 and later 22 with otherwise normal vital signs.  Labs revealed hypokalemia of 2.7 and a CO2 of 34 with a blood glucose of 199.  Magnesium was 2.1 and albumin 3 with a PT of 6.4.  Serum lipase was 25.  CBC showed no leukocytosis 11.4 with neutrophilia.  UA was positive for UTI. EKG as reviewed by me : Showed normal sinus rhythm with rate of 89 with Q waves anteriorly and inferiorly. Imaging: Abdominal pelvic CT scan revealed: 1. Status post partial right hemicolectomy and ileocolic anastomosis. The proximal to distal small bowel is diffusely distended by air and fluid, largest loops measuring 4.0 cm. The terminal ileum proximal to the anastomosis is decompressed, suspect a transition point in the right lower quadrant. There is scattered gas and stool present in the  colon to the rectum. Findings are most consistent with partial small bowel obstruction. 2. Stool balls in the rectum with circumferential wall thickening of the rectal vault, concerning for stercoral colitis. 3. Trace ascites. 4. Small to moderate bilateral pleural effusions and associated atelectasis or consolidation. 5. Cholelithiasis. 6. Coronary artery disease. 7. Aortic Atherosclerosis.  The patient was given them a couple of IV potassium chloride and hydration with IV normal saline at 150 mL/h.  She will be admitted to a medical bed for further evaluation and management. PAST MEDICAL HISTORY:   Past Medical History:  Diagnosis Date  . Alzheimer's dementia ([place])   . Anemia   . CKD (chronic kidney disease)    stage 3  . Colon cancer ([place])   . Constipation   . CVA (cerebral vascular accident) ([place])   . Dementia ([place])   . Hypertension   . PVD (peripheral vascular disease) ([place])   . UTI (urinary tract infection)     PAST SURGICAL HISTORY:   Past Surgical History:  Procedure Laterality Date  . NO PAST SURGERIES    No reported previous surgeries  SOCIAL HISTORY:   Social History   Tobacco Use  . Smoking status: Never  . Smokeless tobacco: Never  Substance Use Topics  . Alcohol use: No    FAMILY HISTORY:   Family History  Family history unknown: Yes  No previous records of familial diseases and it is currently unobtainable from the  patient.  DRUG ALLERGIES:  No Known Allergies  REVIEW OF SYSTEMS:   ROS As per history of present illness.  No other history could be obtained due to the patient's advanced dementia.  MEDICATIONS AT HOME:   Prior to Admission medications   Medication Sig Start Date End Date Taking? Authorizing Provider  acetaminophen (TYLENOL) 325 MG tablet Take 2 tablets (650 mg total) by mouth every 6 (six) hours as needed for mild pain (or Fever >/= 101). 04/28/16   Dustin Flock, MD  acetaminophen (TYLENOL) 650 MG suppository Place 1  suppository (650 mg total) rectally every 6 (six) hours as needed for mild pain (or Fever >/= 101). 04/28/16   Dustin Flock, MD  atenolol (TENORMIN) 50 MG tablet Take 50 mg by mouth daily.    [provider]  cloNIDine (CATAPRES) 0.2 MG tablet Take 1 tablet (0.2 mg total) by mouth 3 (three) times daily. 05/07/16   Donzetta Starch, NP  docusate (COLACE) 50 MG/5ML liquid Take 10 mLs (100 mg total) by mouth 2 (two) times daily. 05/07/16   Donzetta Starch, NP  famotidine (PEPCID) 20 MG tablet Take 1 tablet (20 mg total) by mouth daily. 04/28/16   Dustin Flock, MD  feeding supplement, ENSURE ENLIVE, (ENSURE ENLIVE) LIQD Take 237 mLs by mouth 3 (three) times daily between meals. 05/07/16   Donzetta Starch, NP  felodipine (PLENDIL) 10 MG 24 hr tablet Take 1 tablet (10 mg total) by mouth daily. 04/28/16   Dustin Flock, MD  lisinopril (PRINIVIL,ZESTRIL) 20 MG tablet Take 1 tablet (20 mg total) by mouth 2 (two) times daily. 05/07/16   Donzetta Starch, NP  memantine (NAMENDA) 5 MG tablet Take 1 tablet (5 mg total) by mouth daily. 04/28/16   Dustin Flock, MD  polyethylene glycol [facility] / Floria Raveling) packet Take 17 g by mouth daily as needed for moderate constipation. 05/07/16   Donzetta Starch, NP  senna-docusate (SENOKOT-S) 8.6-50 MG tablet Take 1 tablet by mouth 2 (two) times daily. 05/07/16   Donzetta Starch, NP      VITAL SIGNS:  Blood pressure (!) 161/84, pulse 83, temperature 97.8 F (36.6 C), temperature source Oral, resp. rate (!) 27, height 5\' 4"  (1.626 m), weight 61.3 kg, SpO2 100 %.  PHYSICAL EXAMINATION:  Physical Exam  GENERAL:  87 y.o.-year-old African-American female patient lying in the bed with no acute distress.  EYES: Pupils equal, round, reactive to light and accommodation. No scleral icterus. Extraocular muscles intact.  HEENT: Head atraumatic, normocephalic. Oropharynx and nasopharynx clear.  NECK:  Supple, no jugular venous distention. No thyroid enlargement, no tenderness.   LUNGS: Normal breath sounds bilaterally, no wheezing, rales,rhonchi or crepitation. No use of accessory muscles of respiration.  CARDIOVASCULAR: Regular rate and rhythm, S1, S2 normal. No murmurs, rubs, or gallops.  ABDOMEN: Soft, nondistended, nontender. Bowel sounds present. No organomegaly or mass.  EXTREMITIES: No pedal edema, cyanosis, or clubbing.  NEUROLOGIC: Cranial nerves II through XII are intact. Muscle strength 5/5 in all extremities. Sensation intact. Gait not checked.  PSYCHIATRIC: The patient is alert and oriented x 3.  Normal affect and good eye contact. SKIN: No obvious rash, lesion, or ulcer.   LABORATORY PANEL:   CBC Recent Labs  Lab 11/03/20 0939  WBC 11.4*  HGB 12.7  HCT 40.2  PLT 244   ------------------------------------------------------------------------------------------------------------------  Chemistries  Recent Labs  Lab 11/03/20 0939  NA 145  K 2.7*  CL 103  CO2 34*  GLUCOSE 199*  BUN  20  CREATININE 0.76  CALCIUM 8.0*  MG 2.1  AST 41  ALT 28  ALKPHOS 102  BILITOT 1.0   ------------------------------------------------------------------------------------------------------------------  Cardiac Enzymes No results for input(s): TROPONINI in the last 168 hours. ------------------------------------------------------------------------------------------------------------------  RADIOLOGY:  CT ABDOMEN PELVIS W CONTRAST  Result Date: 11/03/2020 CLINICAL DATA:  Nausea, vomiting, dementia EXAM: CT ABDOMEN AND PELVIS WITH CONTRAST TECHNIQUE: Multidetector CT imaging of the abdomen and pelvis was performed using the standard protocol following bolus administration of intravenous contrast. CONTRAST:  161mL OMNIPAQUE IOHEXOL 350 MG/ML SOLN COMPARISON:  04/27/2016 FINDINGS: Lower chest: Small to moderate bilateral pleural effusions and associated atelectasis or consolidation. Three-vessel coronary artery calcifications. Hepatobiliary: No solid liver  abnormality is seen. Large, rim calcified gallstone in the gallbladder fundus. No gallbladder wall thickening or biliary ductal dilatation. Gallbladder wall thickening, or biliary dilatation. Pancreas: Unremarkable. No pancreatic ductal dilatation or surrounding inflammatory changes. Spleen: Normal in size without significant abnormality. Adrenals/Urinary Tract: Stable, definitively benign bilateral adrenal adenomata (series 2, image 28). Kidneys are normal, without renal calculi, solid lesion, or hydronephrosis. Bladder is unremarkable. Stomach/Bowel: Stomach is within normal limits. Status post partial right hemicolectomy and ileocolic anastomosis. The proximal to distal small bowel is diffusely distended by air and fluid, largest loops measuring 4.0 cm. The terminal ileum proximal to the anastomosis is decompressed, suspect a transition point in the right lower quadrant (series 2, image 60). Descending and sigmoid diverticulosis. Stool balls in the rectum with circumferential wall thickening of the rectal vault (series 2, image 72). Vascular/Lymphatic: Aortic atherosclerosis. No enlarged abdominal or pelvic lymph nodes. Reproductive: Status post hysterectomy. Other: No abdominal wall hernia or abnormality. Trace perihepatic ascites. Musculoskeletal: No acute or significant osseous findings. Nonacute wedge deformities of L1 and L2. IMPRESSION: 1. Status post partial right hemicolectomy and ileocolic anastomosis. The proximal to distal small bowel is diffusely distended by air and fluid, largest loops measuring 4.0 cm. The terminal ileum proximal to the anastomosis is decompressed, suspect a transition point in the right lower quadrant. There is scattered gas and stool present in the colon to the rectum. Findings are most consistent with partial small bowel obstruction. 2. Stool balls in the rectum with circumferential wall thickening of the rectal vault, concerning for stercoral colitis. 3. Trace ascites. 4. Small  to moderate bilateral pleural effusions and associated atelectasis or consolidation. 5. Cholelithiasis. 6. Coronary artery disease. Aortic Atherosclerosis (ICD10-I70.0). Electronically Signed   By: Eddie Candle M.D.   On: 11/03/2020 13:15      IMPRESSION AND PLAN:  Active Problems:   SBO (small bowel obstruction) ([place])  1.  Partial small bowel obstruction. - The patient will be admitted to a medical bed. - She will be kept n.p.o. except for medications with - She will be hydrated with IV normal saline with added potassium chloride. - We will follow 2 view abdomen x-ray in a.m. - General surgery consult will be obtained. - I notified Dr. Peyton Najjar about the patient.  2.  Hypokalemia. - Aggressive potassium replacement will be pursued. - This could certainly be contributing to her SBO.  3.  UTI with subsequent mild sepsis as manifested by tachycardia and tachypnea.. - She will be placed on IV Rocephin and will follow urine culture and sensitivity. - The patient will be hydrated with IV normal saline and will follow blood cultures.  4.  Essential hypertension, currently uncontrolled. - We will continue atenolol and Plendil. - She will be placed on as needed IV labetalol.  5.  Dementia. -  She has no current behavioral changes.  DVT prophylaxis: Lovenox. Code Status: The patient is DNR/DNI. Family Communication:  The plan of care was discussed in details with the patient's who was here earlier by the ER physician and she agreed to proceed with the above mentioned plan. Further management will depend upon hospital course. Disposition Plan: Back to previous home environment Consults called: General surgery. All the records are reviewed and case discussed with ED provider.  Status is: Inpatient  Remains inpatient appropriate because:Ongoing diagnostic testing needed not appropriate for outpatient work up, Unsafe d/c plan, IV treatments appropriate due to intensity of illness or  inability to take PO, and Inpatient level of care appropriate due to severity of illness  Dispo: The patient is from: Home              Anticipated d/c is to: Home              Patient currently is not medically stable to d/c.   Difficult to place patient No      TOTAL TIME TAKING CARE OF THIS PATIENT: 55 minutes.    Christel Mormon M.D on 11/03/2020 at 2:22 PM  [facility]   From 7 PM-7 AM, contact night-coverage [email or web address]  CC: Primary care physician; Gareth Morgan, MD

## 2020-11-03 NOTE — ED Notes (Signed)
Pt back from CT at this time 

## 2020-11-03 NOTE — ED Notes (Signed)
Purewick placed on patient.

## 2020-11-03 NOTE — Consult Note (Signed)
SURGICAL CONSULTATION NOTE   HISTORY OF PRESENT ILLNESS (HPI):  87 y.o. female presented to [facility] ED for evaluation of nausea and vomiting as per family.  Patient with severe dementia unable to give history.  At the moment of my evaluation there was no family member at bedside.  History taken from admitting physician, nurse and chart.  As per chart review patient has been vomiting for the last few days.  Patient unable to say there is any abdominal pain.  No pain radiation.  Unable to elicit if there is any other alleviating or aggravating factor.  Unable to know when was her last bowel movement or passing gas.  At the ED he was found with mild leukocytosis.  There was hypokalemia.  There was also nitrate and leukocytes in the urine.  CT scan of the abdomen pelvis shows small bowel dilation.  No free air.  No fat stranding.  I personally evaluated the images.  Surgery is consulted by Dr. Sidney Ace in this context for evaluation and management of small bowel obstruction.  PAST MEDICAL HISTORY (PMH):  Past Medical History:  Diagnosis Date   Alzheimer's dementia ([place])    Anemia    CKD (chronic kidney disease)    stage 3   Colon cancer (HCC)    Constipation    CVA (cerebral vascular accident) ([place])    Dementia ([place])    Hypertension    PVD (peripheral vascular disease) ([place])    UTI (urinary tract infection)      PAST SURGICAL HISTORY ([place]):  Past Surgical History:  Procedure Laterality Date   NO PAST SURGERIES       MEDICATIONS:  Prior to Admission medications   Medication Sig Start Date End Date Taking? Authorizing Provider  acetaminophen (TYLENOL) 500 MG tablet Take 1,000 mg by mouth at bedtime.   Yes [provider]  atenolol (TENORMIN) 50 MG tablet Take 50 mg by mouth daily.   Yes [provider]  bisacodyl (DULCOLAX) 10 MG suppository Place 10 mg rectally daily as needed for moderate constipation.   Yes [provider]  cholecalciferol (VITAMIN D3) 25  MCG (1000 UNIT) tablet Take 1,000 Units by mouth daily.   Yes [provider]  felodipine (PLENDIL) 10 MG 24 hr tablet Take 1 tablet (10 mg total) by mouth daily. 04/28/16  Yes Dustin Flock, MD  furosemide (LASIX) 20 MG tablet Take 20 mg by mouth daily.   Yes [provider]  lidocaine (LIDODERM) 5 % Place 1 patch onto the skin daily. Remove & Discard patch within 12 hours or as directed by MD   Yes [provider]  Menthol-Zinc Oxide (CALMOSEPTINE) 0.44-20.6 % OINT Apply 1 application topically as needed (diaper changes).   Yes [provider]  nystatin (MYCOSTATIN/NYSTOP) powder Apply 1 application topically 2 (two) times daily as needed (fungal infection).   Yes [provider]  traZODone (DESYREL) 50 MG tablet Take 50 mg by mouth at bedtime.   Yes [provider]  Vitamins A & D (VITAMIN A & D) ointment Apply 1 application topically 2 (two) times daily.   Yes [provider]  Zinc Oxide 13 % CREA Apply 1 application topically 3 (three) times daily. (Apply to buttocks/sacral area after toileting)   Yes [provider]  lactulose, encephalopathy, (CHRONULAC) 10 GM/15ML SOLN Take 10 g by mouth daily.    [provider]     ALLERGIES:  No Known Allergies   SOCIAL HISTORY:  Social History  Socioeconomic History   Marital status: Widowed    Spouse name: Not on file   Number of children: Not on file   Years of education: Not on file   Highest education level: Not on file  Occupational History   Not on file  Tobacco Use   Smoking status: Never   Smokeless tobacco: Never  Substance and Sexual Activity   Alcohol use: No   Drug use: No   Sexual activity: Not on file  Other Topics Concern   Not on file  Social History Narrative   Not on file   Social Determinants of Health   Financial Resource Strain: Not on file  Food Insecurity: Not on file  Transportation Needs: Not on file  Physical Activity:  Not on file  Stress: Not on file  Social Connections: Not on file  Intimate Partner Violence: Not on file      FAMILY HISTORY:  Family History  Family history unknown: Yes     REVIEW OF SYSTEMS:  Unable to assess complete review of system due to severe dementia.  All other review of systems were negative   VITAL SIGNS:  Temp:  [97.8 F (36.6 C)-98.5 F (36.9 C)] 98.5 F (36.9 C) (07/07 2050) Pulse Rate:  [82-91] 88 (07/07 2050) Resp:  [18-27] 20 (07/07 2050) BP: (132-196)/(54-99) 134/54 (07/07 2050) SpO2:  [91 %-100 %] 91 % (07/07 2050) Weight:  [61.3 kg] 61.3 kg (07/07 0931)     Height: 5\' 4"  (162.6 cm) Weight: 61.3 kg BMI (Calculated): 23.19   INTAKE/OUTPUT:  This shift: No intake/output data recorded.  Last 2 shifts: @IOLAST2SHIFTS @   PHYSICAL EXAM:  Constitutional:  -- Normal body habitus  -- Awake, alert, and oriented x3  Eyes:  -- Pupils equally round and reactive to light  -- No scleral icterus  Ear, nose, and throat:  -- No jugular venous distension  Pulmonary:  -- No crackles  -- Equal breath sounds bilaterally -- Breathing non-labored at rest Cardiovascular:  -- S1, S2 present  -- No pericardial rubs Gastrointestinal:  -- Abdomen soft, nontender, distended and tympanic, no guarding or rebound tenderness -- No abdominal masses appreciated, pulsatile or otherwise  Musculoskeletal and Integumentary:  -- Wounds: None appreciated -- Extremities: B/L UE and LE FROM, hands and feet warm, no edema  Neurologic:  -- Motor function: intact and symmetric -- Sensation: intact and symmetric   Labs:  CBC Latest Ref Rng & Units 11/03/2020 05/04/2016 05/03/2016  WBC 4.0 - 10.5 K/uL 11.4(H) 7.9 8.7  Hemoglobin 12.0 - 15.0 g/dL 12.7 9.3(L) 10.6(L)  Hematocrit 36.0 - 46.0 % 40.2 28.3(L) 32.2(L)  Platelets 150 - 400 K/uL 244 173 183   CMP Latest Ref Rng & Units 11/03/2020 05/04/2016 05/03/2016  Glucose 70 - 99 mg/dL 199(H) 196(H) 138(H)  BUN 8 - 23 mg/dL 20 25(H) 15   Creatinine 0.44 - 1.00 mg/dL 0.76 0.99 0.74  Sodium 135 - 145 mmol/L 145 139 138  Potassium 3.5 - 5.1 mmol/L 2.7(LL) 3.8 3.0(L)  Chloride 98 - 111 mmol/L 103 108 106  CO2 22 - 32 mmol/L 34(H) 24 25  Calcium 8.9 - 10.3 mg/dL 8.0(L) 8.0(L) 8.1(L)  Total Protein 6.5 - 8.1 g/dL 6.4(L) - -  Total Bilirubin 0.3 - 1.2 mg/dL 1.0 - -  Alkaline Phos 38 - 126 U/L 102 - -  AST 15 - 41 U/L 41 - -  ALT 0 - 44 U/L 28 - -    Imaging studies:  EXAM: CT ABDOMEN AND  PELVIS WITH CONTRAST   TECHNIQUE: Multidetector CT imaging of the abdomen and pelvis was performed using the standard protocol following bolus administration of intravenous contrast.   CONTRAST:  159mL OMNIPAQUE IOHEXOL 350 MG/ML SOLN   COMPARISON:  04/27/2016   FINDINGS: Lower chest: Small to moderate bilateral pleural effusions and associated atelectasis or consolidation. Three-vessel coronary artery calcifications.   Hepatobiliary: No solid liver abnormality is seen. Large, rim calcified gallstone in the gallbladder fundus. No gallbladder wall thickening or biliary ductal dilatation. Gallbladder wall thickening, or biliary dilatation.   Pancreas: Unremarkable. No pancreatic ductal dilatation or surrounding inflammatory changes.   Spleen: Normal in size without significant abnormality.   Adrenals/Urinary Tract: Stable, definitively benign bilateral adrenal adenomata (series 2, image 28). Kidneys are normal, without renal calculi, solid lesion, or hydronephrosis. Bladder is unremarkable.   Stomach/Bowel: Stomach is within normal limits. Status post partial right hemicolectomy and ileocolic anastomosis. The proximal to distal small bowel is diffusely distended by air and fluid, largest loops measuring 4.0 cm. The terminal ileum proximal to the anastomosis is decompressed, suspect a transition point in the right lower quadrant (series 2, image 60). Descending and sigmoid diverticulosis. Stool balls in the rectum with  circumferential wall thickening of the rectal vault (series 2, image 72).   Vascular/Lymphatic: Aortic atherosclerosis. No enlarged abdominal or pelvic lymph nodes.   Reproductive: Status post hysterectomy.   Other: No abdominal wall hernia or abnormality. Trace perihepatic ascites.   Musculoskeletal: No acute or significant osseous findings. Nonacute wedge deformities of L1 and L2.   IMPRESSION: 1. Status post partial right hemicolectomy and ileocolic anastomosis. The proximal to distal small bowel is diffusely distended by air and fluid, largest loops measuring 4.0 cm. The terminal ileum proximal to the anastomosis is decompressed, suspect a transition point in the right lower quadrant. There is scattered gas and stool present in the colon to the rectum. Findings are most consistent with partial small bowel obstruction. 2. Stool balls in the rectum with circumferential wall thickening of the rectal vault, concerning for stercoral colitis. 3. Trace ascites. 4. Small to moderate bilateral pleural effusions and associated atelectasis or consolidation. 5. Cholelithiasis. 6. Coronary artery disease.   Aortic Atherosclerosis (ICD10-I70.0).     Electronically Signed   By: Eddie Candle M.D.   On: 11/03/2020 13:15  Assessment/Plan:  87 y.o. female with small bowel obstruction, complicated by pertinent comorbidities including UTI, severe dementia, CVA, hypertension, stage III chronic kidney disease.  Patient with imaging consistent with small bowel obstruction.  Upon valuation of the patient around 4 PM I ordered a nasogastric tube.  Reevaluation at 11 PM and NG tube will has not been in place.  I notified the nurse and NGT will be placed.  My recommendation will be to keep NGT to low intermittent suction and repeat x-ray in the morning.  Patient not a great candidate for surgical management will try to continue conservative management.  I agree with treatment for the UTI and  electrolyte disturbances.  Keep patient hydrated.  I will continue to follow closely.   Arnold Long, MD

## 2020-11-03 NOTE — ED Notes (Signed)
Pts K+ is 2.7 Dr. Archie Balboa aware

## 2020-11-03 NOTE — ED Notes (Signed)
Patient transported to CT 

## 2020-11-03 NOTE — ED Notes (Signed)
Informed RN bed assigned 

## 2020-11-03 NOTE — ED Triage Notes (Signed)
Pt to ED via [facility] with complaints of N/V from home. She has dementia and is unable to communicate. Family reports that she has had N/V for the last two days, about 8-10 times in the last two days.

## 2020-11-03 NOTE — ED Provider Notes (Signed)
[facility] Emergency Department Provider Note   ____________________________________________   I have reviewed the triage vital signs and the nursing notes.   HISTORY  Chief Complaint Nausea and Emesis   History limited by: Dementia   HPI [name] is a 87 y.o. female who presents to the emergency department today because of concern for nausea and vomiting. Patient has dementia and cannot give any significant history. The patient was accompanied by family. Family states that the patient has history of difficulty moving her bowels and is supposed to be on chronic laxative. She thinks that the patient might not have been having her laxative as scheduled. Apparently was given an enema through pace with some output however it did not help the nausea or vomiting. The patient has not had any bloody emesis.   Records reviewed. Per medical record review patient has a history of colon cancer. Dementia.   Past Medical History:  Diagnosis Date   Alzheimer's dementia ([place])    Anemia    CKD (chronic kidney disease)    stage 3   Colon cancer (HCC)    Constipation    CVA (cerebral vascular accident) ([place])    Dementia ([place])    Hypertension    PVD (peripheral vascular disease) ([place])    UTI (urinary tract infection)     Patient Active Problem List   Diagnosis Date Noted   IVH (intraventricular hemorrhage) ([place]) 05/07/2016   Fever in adult 05/07/2016   Hx of ischemic left MCA stroke 05/07/2016   Adrenal nodule ([place]) 05/07/2016   Liver nodule 05/07/2016   DNR (do not resuscitate) 05/07/2016   Hypokalemia    Malignant hypertension    Acute encephalopathy 04/28/2016   Urinary tract infection without hematuria 04/28/2016   Alzheimer's dementia ([place]) 04/28/2016   HTN (hypertension) 04/28/2016   PVD (peripheral vascular disease) ([place]) 04/28/2016   ICH (intracerebral hemorrhage) ([place]) - L Parietal ICH, HTN vs CAA source 04/28/2016    Past Surgical History:   Procedure Laterality Date   NO PAST SURGERIES      Prior to Admission medications   Medication Sig Start Date End Date Taking? Authorizing Provider  acetaminophen (TYLENOL) 325 MG tablet Take 2 tablets (650 mg total) by mouth every 6 (six) hours as needed for mild pain (or Fever >/= 101). 04/28/16   Dustin Flock, MD  acetaminophen (TYLENOL) 650 MG suppository Place 1 suppository (650 mg total) rectally every 6 (six) hours as needed for mild pain (or Fever >/= 101). 04/28/16   Dustin Flock, MD  atenolol (TENORMIN) 50 MG tablet Take 50 mg by mouth daily.    [provider]  cloNIDine (CATAPRES) 0.2 MG tablet Take 1 tablet (0.2 mg total) by mouth 3 (three) times daily. 05/07/16   Donzetta Starch, NP  docusate (COLACE) 50 MG/5ML liquid Take 10 mLs (100 mg total) by mouth 2 (two) times daily. 05/07/16   Donzetta Starch, NP  famotidine (PEPCID) 20 MG tablet Take 1 tablet (20 mg total) by mouth daily. 04/28/16   Dustin Flock, MD  feeding supplement, ENSURE ENLIVE, (ENSURE ENLIVE) LIQD Take 237 mLs by mouth 3 (three) times daily between meals. 05/07/16   Donzetta Starch, NP  felodipine (PLENDIL) 10 MG 24 hr tablet Take 1 tablet (10 mg total) by mouth daily. 04/28/16   Dustin Flock, MD  lisinopril (PRINIVIL,ZESTRIL) 20 MG tablet Take 1 tablet (20 mg total) by mouth 2 (two) times daily. 05/07/16   Donzetta Starch, NP  memantine (NAMENDA) 5 MG tablet Take 1 tablet (5 mg total) by mouth daily. 04/28/16   Dustin Flock, MD  polyethylene glycol [facility] / Floria Raveling) packet Take 17 g by mouth daily as needed for moderate constipation. 05/07/16   Donzetta Starch, NP  senna-docusate (SENOKOT-S) 8.6-50 MG tablet Take 1 tablet by mouth 2 (two) times daily. 05/07/16   Donzetta Starch, NP    Allergies Patient has no known allergies.  Family History  Family history unknown: Yes    Social History Social History   Tobacco Use   Smoking status: Never   Smokeless tobacco: Never  Substance Use Topics    Alcohol use: No   Drug use: No    Review of Systems Unable to obtain secondary to dementia.   ____________________________________________   PHYSICAL EXAM:  VITAL SIGNS: ED Triage Vitals  Enc Vitals Group     BP 11/03/20 0930 (!) 168/89     Pulse Rate 11/03/20 0930 89     Resp 11/03/20 0930 20     Temp 11/03/20 0930 97.8 F (36.6 C)     Temp Source 11/03/20 0930 Oral     SpO2 11/03/20 0930 100 %     Weight 11/03/20 0931 135 lb 2.3 oz (61.3 kg)     Height 11/03/20 0931 5\' 4"  (1.626 m)    Constitutional: Awake and alert.  Eyes: Conjunctivae are normal.  ENT      Head: Normocephalic and atraumatic.      Nose: No congestion/rhinnorhea.      Mouth/Throat: Mucous membranes are moist.      Neck: No stridor. Hematological/Lymphatic/Immunilogical: No cervical lymphadenopathy. Cardiovascular: Normal rate, regular rhythm.  No murmurs, rubs, or gallops.  Respiratory: Normal respiratory effort without tachypnea nor retractions. Breath sounds are clear and equal bilaterally. No wheezes/rales/rhonchi. Gastrointestinal: Distended. Tympanitic.  Genitourinary: Deferred Musculoskeletal: Normal range of motion in all extremities. No lower extremity edema. Neurologic:  Awake and alert. Dementia.  Skin:  Skin is warm, dry and intact. No rash noted. ____________________________________________    LABS (pertinent positives/negatives)  CBC wbc 11.4, hgb 12.7, plt 244 Trop hs 15 Lipase 25 CMP na 145, k 2.7, glu 199, cr 0.76 ____________________________________________   EKG  I, Nance Pear, attending physician, personally viewed and interpreted this EKG  EKG Time: 0929 Rate: 89 Rhythm: sinus rhythm Axis: left axis deviation Intervals: qtc 468 QRS: narrow, q waves III, aVF ST changes: no st elevation Impression: abnormal ekg  ____________________________________________    RADIOLOGY  CT abd/pel Concern for partial  SBO.  ____________________________________________   PROCEDURES  Procedures  ____________________________________________   INITIAL IMPRESSION / ASSESSMENT AND PLAN / ED COURSE  Pertinent labs & imaging results that were available during my care of the patient were reviewed by me and considered in my medical decision making (see chart for details).   Patient presents to the emergency department because of concern for nausea and vomiting. The patient has a distended and tympanitic abdomen. The patient had CT scan performed which was concerning for partial SBO. The patient does have history of colon cancer and resection per daughter. Potassium was low. Will plan on admission. Did start potassium repletion here in the emergency department.   ____________________________________________   FINAL CLINICAL IMPRESSION(S) / ED DIAGNOSES  Final diagnoses:  Partial small bowel obstruction ([place])  Hypokalemia     Note: This dictation was prepared with Dragon dictation. Any transcriptional errors that result from this process are unintentional     Nance Pear, MD 11/03/20  1400  

## 2020-11-03 NOTE — Progress Notes (Signed)
NG tube has been placed. Patient tolerated procedure well. Awaiting xray to confirm placement.

## 2020-11-04 ENCOUNTER — Inpatient Hospital Stay: Payer: Medicare (Managed Care)

## 2020-11-04 DIAGNOSIS — F0390 Unspecified dementia without behavioral disturbance: Secondary | ICD-10-CM

## 2020-11-04 DIAGNOSIS — K56609 Unspecified intestinal obstruction, unspecified as to partial versus complete obstruction: Secondary | ICD-10-CM

## 2020-11-04 LAB — CBC
HCT: 36 % (ref 36.0–46.0)
Hemoglobin: 11.7 g/dL — ABNORMAL LOW (ref 12.0–15.0)
MCH: 29 pg (ref 26.0–34.0)
MCHC: 32.5 g/dL (ref 30.0–36.0)
MCV: 89.1 fL (ref 80.0–100.0)
Platelets: 211 10*3/uL (ref 150–400)
RBC: 4.04 MIL/uL (ref 3.87–5.11)
RDW: 13.8 % (ref 11.5–15.5)
WBC: 10.2 10*3/uL (ref 4.0–10.5)
nRBC: 0 % (ref 0.0–0.2)

## 2020-11-04 LAB — BASIC METABOLIC PANEL
Anion gap: 7 (ref 5–15)
Anion gap: 5 (ref 5–15)
BUN: 16 mg/dL (ref 8–23)
BUN: 13 mg/dL (ref 8–23)
CO2: 32 mmol/L (ref 22–32)
CO2: 28 mmol/L (ref 22–32)
Calcium: 8.2 mg/dL — ABNORMAL LOW (ref 8.9–10.3)
Calcium: 8.1 mg/dL — ABNORMAL LOW (ref 8.9–10.3)
Chloride: 109 mmol/L (ref 98–111)
Chloride: 109 mmol/L (ref 98–111)
Creatinine, Ser: 0.64 mg/dL (ref 0.44–1.00)
Creatinine, Ser: 0.6 mg/dL (ref 0.44–1.00)
GFR, Estimated: >60 mL/min (ref >60)
GFR, Estimated: >60 mL/min (ref >60)
Glucose, Bld: 121 mg/dL — ABNORMAL HIGH (ref 70–99)
Glucose, Bld: 133 mg/dL — ABNORMAL HIGH (ref 70–99)
Potassium: 2.9 mmol/L — ABNORMAL LOW (ref 3.5–5.1)
Potassium: 3.9 mmol/L (ref 3.5–5.1)
Sodium: 148 mmol/L — ABNORMAL HIGH (ref 135–145)
Sodium: 142 mmol/L (ref 135–145)

## 2020-11-04 LAB — LACTIC ACID, PLASMA: Lactic Acid, Venous: 1 mmol/L (ref 0.5–1.9)

## 2020-11-04 LAB — MAGNESIUM: Magnesium: 1.8 mg/dL (ref 1.7–2.4)

## 2020-11-04 MED ORDER — POTASSIUM CHLORIDE 10 MEQ/100ML IV SOLN
10.0000 meq | INTRAVENOUS | Status: AC
  Administered 2020-11-04 (×4): 10 meq via INTRAVENOUS
  Filled 2020-11-04 (×3): qty 100

## 2020-11-04 MED ORDER — HYDRALAZINE HCL 20 MG/ML IJ SOLN
5.0000 mg | INTRAMUSCULAR | Status: DC | PRN
  Administered 2020-11-05 – 2020-11-06 (×3): 5 mg via INTRAVENOUS
  Filled 2020-11-04 (×4): qty 1

## 2020-11-04 MED ORDER — KCL IN DEXTROSE-NACL 40-5-0.45 MEQ/L-%-% IV SOLN
INTRAVENOUS | Status: DC
  Filled 2020-11-04 (×10): qty 1000

## 2020-11-04 MED ORDER — METOPROLOL TARTRATE 5 MG/5ML IV SOLN
5.0000 mg | Freq: Two times a day (BID) | INTRAVENOUS | Status: DC
  Administered 2020-11-04 – 2020-11-08 (×9): 5 mg via INTRAVENOUS
  Filled 2020-11-04 (×9): qty 5

## 2020-11-04 NOTE — Consult Note (Addendum)
PHARMACY CONSULT NOTE - FOLLOW UP  Pharmacy Consult for Electrolyte Monitoring and Replacement   Recent Labs: Potassium (mmol/L)  Date Value  11/04/2020 2.9 (L)  12/12/2013 3.8   Magnesium (mg/dL)  Date Value  11/03/2020 2.1   Calcium (mg/dL)  Date Value  11/04/2020 8.2 (L)   Calcium, Total (mg/dL)  Date Value  12/12/2013 8.2 (L)   Albumin (g/dL)  Date Value  11/03/2020 3.0 (L)  12/12/2013 2.7 (L)   Phosphorus (mg/dL)  Date Value  05/05/2016 3.5   Sodium (mmol/L)  Date Value  11/04/2020 148 (H)  12/12/2013 147 (H)   CrCl 40.60ml/min  Assessment: 87 y.o. female with small bowel obstruction, complicated by pertinent comorbidities including UTI, severe dementia, CVA, hypertension, stage III chronic kidney disease - pharmacy has been consulted to monitor and replenish electrolytes.  Pt currently on NS w 69meq @ 171ml/hr since 1445 7/7.    Pt hypokalemic with K = 2.9, mildly Hypernatremic with Na = 148  Goal of Therapy:  Electrolytes wnl's  Plan:  Will continue IVF's w Kcl, but adjust to D51/2NS Will also order KCL 45meq IV x4 Will recheck BMP/Mg @ 2000 and continue to replenish if needed Will recheck electrolytes with am labs   Lu Duffel ,PharmD Clinical Pharmacist 11/04/2020 12:35 PM

## 2020-11-04 NOTE — Progress Notes (Addendum)
1       [place] [facility] [place] NAME: [name]    MR#:  [id number]  PCP: Gareth Morgan, MD  DATE OF BIRTH:  1927-07-10  SUBJECTIVE:  CHIEF COMPLAINT:   Chief Complaint  Patient presents with  . Nausea  . Emesis  Patient not able to provide much history due to severe dementia.  NG tube in place.  Daughter and daughter-in-law at bedside. REVIEW OF SYSTEMS:  ROS unable to provide due to severe dementia DRUG ALLERGIES:  No Known Allergies VITALS:  Blood pressure (!) 180/74, pulse 87, temperature 98.9 F (37.2 C), resp. rate 16, height 5\' 4"  (1.626 m), weight 61.3 kg, SpO2 97 %. PHYSICAL EXAMINATION:  Physical Exam 87 year old female lying in the bed without any acute discomfort HEENT: Head atraumatic, normocephalic, NG tube in place with low intermittent suction Neck supple, no jugular venous distention Lungs clear to auscultation bilaterally.  No wheezing rales rhonchi crepitation Cardiovascular S1-S2 normal, no murmur rales or gallop Abdomen soft nondistended, nontender Skin no rash or lesion Neurology difficult to evaluate due to her severe dementia.  She does not communicate LABORATORY PANEL:  Female CBC Recent Labs  Lab 11/04/20 0513  WBC 10.2  HGB 11.7*  HCT 36.0  PLT 211   ------------------------------------------------------------------------------------------------------------------ Chemistries  Recent Labs  Lab 11/03/20 0939 11/04/20 0513  NA 145 148*  K 2.7* 2.9*  CL 103 109  CO2 34* 32  GLUCOSE 199* 121*  BUN 20 16  CREATININE 0.76 0.64  CALCIUM 8.0* 8.2*  MG 2.1  --   AST 41  --   ALT 28  --   ALKPHOS 102  --   BILITOT 1.0  --    RADIOLOGY:  DG Abd 2 Views  Result Date: 11/04/2020 CLINICAL DATA:  87 year old female with history of nausea and vomiting. EXAM: ABDOMEN - 2 VIEW COMPARISON:  11/03/2020. FINDINGS: Nasogastric tube is present, coiled in the stomach with tip in the proximal stomach. Multiple dilated loops  of small bowel are noted throughout the abdomen, measuring up to 4 cm in diameter. Some gas and stool is noted throughout the colon and distal rectum. No pneumoperitoneum. Visualized portions of the lower thorax demonstrates small bilateral pleural effusions and bibasilar opacities which are favored to reflect areas of subsegmental atelectasis. IMPRESSION: 1. Tip of nasogastric tube is in the proximal stomach. 2. Persistent evidence of partial small bowel obstruction, as above. 3. Small bilateral pleural effusions with bibasilar areas of subsegmental atelectasis in the lower lobes of the lungs bilaterally. Electronically Signed   By: Vinnie Langton M.D.   On: 11/04/2020 07:15   DG Abd Portable 1V  Result Date: 11/04/2020 CLINICAL DATA:  NG tube placement EXAM: PORTABLE ABDOMEN - 1 VIEW COMPARISON:  CT abdomen/pelvis dated 11/03/2020 FINDINGS: Enteric tube looped in the proximal stomach, terminating in the gastric cardia. Multiple dilated loops of small bowel, suggesting small bowel obstruction. Possible trace left pleural effusion. IMPRESSION: Enteric tube looped in the proximal stomach, terminating in the gastric cardia. Electronically Signed   By: Julian Hy M.D.   On: 11/04/2020 00:12   ASSESSMENT AND PLAN:  87 y.o. African-American female with medical history significant for Dementia, CVA, hypertension, PVD, stage III CKD admitted for partial small bowel obstruction with symptoms of acute onset of recurrent vomiting over the last few days.    Active Problems:   SBO (small bowel obstruction) ([place])  1.  Partial small bowel obstruction. -NG tube placed and connected  to suction.  About 200 cc of bilious content drained. Dr. Peyton Najjar following.  Not a good surgical candidate per his recommendation  2.  Hypokalemia. - Aggressive potassium replacement will be pursued.  Pharmacy managing this - This could certainly be contributing to her SBO.  3.  UTI with subsequent mild sepsis as manifested  by tachycardia and tachypnea.. -Continue IV Rocephin and monitor urine culture and sensitivity.  Currently pending -Continue IV normal saline and will follow blood cultures> Remains negative thus far  4.  Essential hypertension, currently uncontrolled. -Considering she is not able to keep anything oral.  We will start IV Lopressor 5 mg twice daily and as needed hydralazine IV for better blood pressure control  5.  Dementia. -  no current behavioral changes. -At baseline  Body mass index is 23.2 kg/m.  Net IO Since Admission: 100 mL [11/04/20 1503]     Status is: Inpatient  Remains inpatient appropriate because:IV treatments appropriate due to intensity of illness or inability to take PO  Dispo: The patient is from: Home              Anticipated d/c is to: Home              Patient currently is not medically stable to d/c.   Difficult to place patient No   DVT prophylaxis:       enoxaparin (LOVENOX) injection 40 mg Start: 11/03/20 2200     Family Communication: (Daughter and daughter-in-law updated at bedside on 11/04/2020)   All the records are reviewed and case discussed with Care Management/Social Worker. Management plans discussed with the patient, family and they are in agreement.  CODE STATUS: DNR Level of care: Med-Surg  TOTAL TIME TAKING CARE OF THIS PATIENT: 35 minutes.   More than 50% of the time was spent in counseling/coordination of care: YES  POSSIBLE D/C IN 2-3 DAYS, DEPENDING ON CLINICAL CONDITION.   Max Sane M.D on 11/04/2020 at 3:03 PM  [facility]   CC: Primary care physician; Gareth Morgan, MD  Note: This dictation was prepared with Dragon dictation along with smaller phrase technology. Any transcriptional errors that result from this process are unintentional.

## 2020-11-04 NOTE — Progress Notes (Signed)
Patient ID: [name], female   DOB: [birth year], 87 y.o.   MRN: [id number]     [facility] Day(s): 1.   Interval History: Patient seen and examined, no acute events or new complaints overnight.  Patient with severe dementia unable to give history.  As per nurse no issues overnight.  No sign of bowel movement.  Vital signs in last 24 hours: [min-max] current  Temp:  [97.8 F (36.6 C)-98.9 F (37.2 C)] 98.9 F (37.2 C) (07/08 0745) Pulse Rate:  [82-97] 97 (07/08 0745) Resp:  [18-27] 20 (07/08 0745) BP: (132-196)/(54-99) 177/92 (07/08 0745) SpO2:  [91 %-100 %] 100 % (07/08 0745) Weight:  [61.3 kg] 61.3 kg (07/07 0931)     Height: 5\' 4"  (162.6 cm) Weight: 61.3 kg BMI (Calculated): 23.19   Physical Exam:  Constitutional: Awake but not talkative, baseline dementia Respiratory: breathing non-labored at rest  Cardiovascular: regular rate and sinus rhythm  Gastrointestinal: soft, non-tender, and distended  Labs:  CBC Latest Ref Rng & Units 11/04/2020 11/03/2020 05/04/2016  WBC 4.0 - 10.5 K/uL 10.2 11.4(H) 7.9  Hemoglobin 12.0 - 15.0 g/dL 11.7(L) 12.7 9.3(L)  Hematocrit 36.0 - 46.0 % 36.0 40.2 28.3(L)  Platelets 150 - 400 K/uL 211 244 173   CMP Latest Ref Rng & Units 11/04/2020 11/03/2020 05/04/2016  Glucose 70 - 99 mg/dL 121(H) 199(H) 196(H)  BUN 8 - 23 mg/dL 16 20 25(H)  Creatinine 0.44 - 1.00 mg/dL 0.64 0.76 0.99  Sodium 135 - 145 mmol/L 148(H) 145 139  Potassium 3.5 - 5.1 mmol/L 2.9(L) 2.7(LL) 3.8  Chloride 98 - 111 mmol/L 109 103 108  CO2 22 - 32 mmol/L 32 34(H) 24  Calcium 8.9 - 10.3 mg/dL 8.2(L) 8.0(L) 8.0(L)  Total Protein 6.5 - 8.1 g/dL - 6.4(L) -  Total Bilirubin 0.3 - 1.2 mg/dL - 1.0 -  Alkaline Phos 38 - 126 U/L - 102 -  AST 15 - 41 U/L - 41 -  ALT 0 - 44 U/L - 28 -    Imaging studies: Abdominal x-ray shows persistent small bowel dilation.  I personally evaluated the images.   Assessment/Plan:  87 y.o. female with small bowel obstruction, complicated  by pertinent comorbidities including UTI, severe dementia, CVA, hypertension, stage III chronic kidney disease.  Patient without any clinical deterioration.  NGT was placed and I brought the suction was connected to the venting tube and to remain drainage tube.  I change the suction to the main tube and took out 200 cc of bilious content.  Patient still with hypokalemia.  Improved white blood cell count.  Continue treatment for UTI and electrolyte replacement.  We will continue with conservative management of small bowel obstruction with NGT decompression and IV hydration.  I will continue to follow closely.  Arnold Long, MD

## 2020-11-05 ENCOUNTER — Inpatient Hospital Stay: Payer: Medicare (Managed Care)

## 2020-11-05 LAB — CBC
HCT: 39.1 % (ref 36.0–46.0)
Hemoglobin: 12.4 g/dL (ref 12.0–15.0)
MCH: 28.8 pg (ref 26.0–34.0)
MCHC: 31.7 g/dL (ref 30.0–36.0)
MCV: 90.9 fL (ref 80.0–100.0)
Platelets: 219 10*3/uL (ref 150–400)
RBC: 4.3 MIL/uL (ref 3.87–5.11)
RDW: 13.8 % (ref 11.5–15.5)
WBC: 11.6 10*3/uL — ABNORMAL HIGH (ref 4.0–10.5)
nRBC: 0 % (ref 0.0–0.2)

## 2020-11-05 LAB — BASIC METABOLIC PANEL
Anion gap: 5 (ref 5–15)
BUN: 9 mg/dL (ref 8–23)
CO2: 26 mmol/L (ref 22–32)
Calcium: 8.2 mg/dL — ABNORMAL LOW (ref 8.9–10.3)
Chloride: 109 mmol/L (ref 98–111)
Creatinine, Ser: 0.52 mg/dL (ref 0.44–1.00)
GFR, Estimated: >60 mL/min (ref >60)
Glucose, Bld: 145 mg/dL — ABNORMAL HIGH (ref 70–99)
Potassium: 3.7 mmol/L (ref 3.5–5.1)
Sodium: 140 mmol/L (ref 135–145)

## 2020-11-05 MED ORDER — DIATRIZOATE MEGLUMINE & SODIUM 66-10 % PO SOLN
90.0000 mL | Freq: Once | ORAL | Status: AC
  Administered 2020-11-05: 90 mL via NASOGASTRIC

## 2020-11-05 NOTE — Progress Notes (Signed)
1       [place] at [place] NAME: [name]    MR#:  [id number]  PCP: Gareth Morgan, MD  DATE OF BIRTH:  [birth year]  SUBJECTIVE:  CHIEF COMPLAINT:   Chief Complaint  Patient presents with  . Nausea  . Emesis  Patient not able to provide much history due to severe dementia.  NG tube remains in place.  No family at bedside  REVIEW OF SYSTEMS:  ROS unable to provide due to severe dementia DRUG ALLERGIES:  No Known Allergies VITALS:  Blood pressure (!) 176/88, pulse 98, temperature 98 F (36.7 C), temperature source Oral, resp. rate 20, height 5\' 4"  (1.626 m), weight 61.3 kg, SpO2 99 %. PHYSICAL EXAMINATION:  Physical Exam 87 year old female lying in the bed without any acute discomfort HEENT: Head atraumatic, normocephalic, NG tube in place with low intermittent suction Neck supple, no jugular venous distention Lungs clear to auscultation bilaterally.  No wheezing rales rhonchi crepitation Cardiovascular S1-S2 normal, no murmur rales or gallop Abdomen soft nondistended, nontender Skin no rash or lesion Neurology difficult to evaluate due to her severe dementia.  She does not communicate LABORATORY PANEL:  Female CBC Recent Labs  Lab 11/05/20 0500  WBC 11.6*  HGB 12.4  HCT 39.1  PLT 219    ------------------------------------------------------------------------------------------------------------------ Chemistries  Recent Labs  Lab 11/03/20 0939 11/04/20 0513 11/04/20 1954 11/05/20 0500  NA 145   < > 142 140  K 2.7*   < > 3.9 3.7  CL 103   < > 109 109  CO2 34*   < > 28 26  GLUCOSE 199*   < > 133* 145*  BUN 20   < > 13 9  CREATININE 0.76   < > 0.60 0.52  CALCIUM 8.0*   < > 8.1* 8.2*  MG 2.1  --  1.8  --   AST 41  --   --   --   ALT 28  --   --   --   ALKPHOS 102  --   --   --   BILITOT 1.0  --   --   --    < > = values in this interval not displayed.    RADIOLOGY:  DG Abd 2 Views  Result Date: 11/05/2020 CLINICAL DATA:   Follow-up small bowel obstruction. EXAM: ABDOMEN - 2 VIEW COMPARISON:  Plain films of the abdomen dated 11/04/2020 and 11/03/2020. CT abdomen dated 11/03/2020. FINDINGS: Persistent mildly distended gas-filled loops of small bowel throughout the abdomen, not significantly changed compared to the previous exams. Colonic bowel gas is seen. Enteric tube appears appropriately positioned in the stomach. No evidence of free intraperitoneal air. Probable atelectasis and/or small pleural effusions at the bilateral lung bases. IMPRESSION: No significant interval change. Persistent evidence of partial small bowel obstruction. Electronically Signed   By: Franki Cabot M.D.   On: 11/05/2020 10:42   ASSESSMENT AND PLAN:  87 y.o. African-American female with medical history significant for Dementia, CVA, hypertension, PVD, stage III CKD admitted for partial small bowel obstruction with symptoms of acute onset of recurrent vomiting over the last few days.    Active Problems:   SBO (small bowel obstruction) ([place])  1.  Partial small bowel obstruction. -Continue NG tube with intermittent suction.  Abdominal x-ray remains unchanged  Dr. Peyton Najjar following.  Not a good surgical candidate per his recommendation  2.  Hypokalemia. - Repleted and resolved  3.  UTI with subsequent mild  sepsis as manifested by tachycardia and tachypnea.. -Continue IV Rocephin.  Urine culture growing gram-negative rods -Continue IV normal saline and will follow blood cultures> Remains negative thus far  4.  Essential hypertension, currently uncontrolled. -Considering she is not able to keep anything oral.  Continue IV Lopressor 5 mg twice daily and as needed hydralazine IV for better blood pressure control  5.  Dementia. -  no current behavioral changes. -At baseline  Body mass index is 23.2 kg/m.  Net IO Since Admission: 2,879.55 mL [11/05/20 1250]     Status is: Inpatient  Remains inpatient appropriate because:IV treatments  appropriate due to intensity of illness or inability to take PO  Dispo: The patient is from: Home              Anticipated d/c is to: Home              Patient currently is not medically stable to d/c.   Difficult to place patient No   DVT prophylaxis:       enoxaparin (LOVENOX) injection 40 mg Start: 11/03/20 2200     Family Communication: (Daughter was updated over phone on 11/05/2020)   All the records are reviewed and case discussed with Care Management/Social Worker. Management plans discussed with the patient, family and they are in agreement.  CODE STATUS: DNR Level of care: Med-Surg  TOTAL TIME TAKING CARE OF THIS PATIENT: 35 minutes.   More than 50% of the time was spent in counseling/coordination of care: YES  POSSIBLE D/C IN 2-3 DAYS, DEPENDING ON CLINICAL CONDITION.   Max Sane M.D on 11/05/2020 at 12:50 PM  [facility]   CC: Primary care physician; Gareth Morgan, MD  Note: This dictation was prepared with Dragon dictation along with smaller phrase technology. Any transcriptional errors that result from this process are unintentional.

## 2020-11-05 NOTE — Progress Notes (Signed)
Patient ID: [name], female   DOB: 07/16/1927, 87 y.o.   MRN: [id number]     [facility] Day(s): 2.   Interval History: Patient seen and examined, no acute events or new complaints overnight. Patient unable to give any history due to severe dementia.  As per nurse the patient had 2 bowel movement yesterday and 1 small and 1 medium 1.  Vital signs in last 24 hours: [min-max] current  Temp:  [98 F (36.7 C)-99.5 F (37.5 C)] 98 F (36.7 C) (07/09 0421) Pulse Rate:  [82-98] 98 (07/09 0421) Resp:  [16-20] 20 (07/09 0421) BP: (167-188)/(73-99) 176/88 (07/09 0625) SpO2:  [95 %-99 %] 99 % (07/09 0421)     Height: 5\' 4"  (162.6 cm) Weight: 61.3 kg BMI (Calculated): 23.19   Physical Exam:  Constitutional: alert, cooperative and no distress  Respiratory: breathing non-labored at rest  Cardiovascular: regular rate and sinus rhythm  Gastrointestinal: soft, non-tender, and mildly-distended  Labs:  CBC Latest Ref Rng & Units 11/05/2020 11/04/2020 11/03/2020  WBC 4.0 - 10.5 K/uL 11.6(H) 10.2 11.4(H)  Hemoglobin 12.0 - 15.0 g/dL 12.4 11.7(L) 12.7  Hematocrit 36.0 - 46.0 % 39.1 36.0 40.2  Platelets 150 - 400 K/uL 219 211 244   CMP Latest Ref Rng & Units 11/05/2020 11/04/2020 11/04/2020  Glucose 70 - 99 mg/dL 145(H) 133(H) 121(H)  BUN 8 - 23 mg/dL 9 13 16   Creatinine 0.44 - 1.00 mg/dL 0.52 0.60 0.64  Sodium 135 - 145 mmol/L 140 142 148(H)  Potassium 3.5 - 5.1 mmol/L 3.7 3.9 2.9(L)  Chloride 98 - 111 mmol/L 109 109 109  CO2 22 - 32 mmol/L 26 28 32  Calcium 8.9 - 10.3 mg/dL 8.2(L) 8.1(L) 8.2(L)  Total Protein 6.5 - 8.1 g/dL - - -  Total Bilirubin 0.3 - 1.2 mg/dL - - -  Alkaline Phos 38 - 126 U/L - - -  AST 15 - 41 U/L - - -  ALT 0 - 44 U/L - - -    Imaging studies: No new pertinent imaging studies   Assessment/Plan:  87 y.o. female with small bowel obstruction, complicated by pertinent comorbidities including UTI, severe dementia, CVA, hypertension, stage III chronic  kidney disease.  Now with potassium within normal limits and the treatment of UTI patient seems to be moving her bowels better.  Still distended physical exam unable to get information if she is passing gas.  I will order x-ray to assess small bowel dilation.  Depending on the x-ray results and will be able to clamp the NG tube and give clear liquid trial versus needing Gastrografin for better assessment of GI motility.  We will continue with conservative management of bowel obstruction at this moment.  Arnold Long, MD

## 2020-11-05 NOTE — Plan of Care (Signed)

## 2020-11-05 NOTE — Consult Note (Signed)
PHARMACY CONSULT NOTE - FOLLOW UP  Pharmacy Consult for Electrolyte Monitoring and Replacement   Recent Labs: Potassium (mmol/L)  Date Value  11/05/2020 3.7  12/12/2013 3.8   Magnesium (mg/dL)  Date Value  11/04/2020 1.8   Calcium (mg/dL)  Date Value  11/05/2020 8.2 (L)   Calcium, Total (mg/dL)  Date Value  12/12/2013 8.2 (L)   Albumin (g/dL)  Date Value  11/03/2020 3.0 (L)  12/12/2013 2.7 (L)   Phosphorus (mg/dL)  Date Value  05/05/2016 3.5   Sodium (mmol/L)  Date Value  11/05/2020 140  12/12/2013 147 (H)    Assessment: 87 y.o. female with small bowel obstruction, complicated by pertinent comorbidities including UTI, severe dementia, CVA, hypertension, stage III chronic kidney disease - pharmacy has been consulted to monitor and replenish electrolytes.   Goal of Therapy:  Electrolytes wnl  Plan:  Electrolytes improved. Patient remains NPO with plan for possible trial of CLD. Will continue with MIVF with potassium. Once patient is able to start and tolerate diet, should be able to transition off of fluids.  Tawnya Crook ,PharmD Clinical Pharmacist 11/05/2020 8:44 AM

## 2020-11-06 ENCOUNTER — Inpatient Hospital Stay: Payer: Medicare (Managed Care)

## 2020-11-06 LAB — CBC
HCT: 40.7 % (ref 36.0–46.0)
Hemoglobin: 13.1 g/dL (ref 12.0–15.0)
MCH: 29.6 pg (ref 26.0–34.0)
MCHC: 32.2 g/dL (ref 30.0–36.0)
MCV: 92.1 fL (ref 80.0–100.0)
Platelets: 261 10*3/uL (ref 150–400)
RBC: 4.42 MIL/uL (ref 3.87–5.11)
RDW: 13.8 % (ref 11.5–15.5)
WBC: 13.5 10*3/uL — ABNORMAL HIGH (ref 4.0–10.5)
nRBC: 0 % (ref 0.0–0.2)

## 2020-11-06 LAB — BASIC METABOLIC PANEL
Anion gap: 4 — ABNORMAL LOW (ref 5–15)
BUN: 8 mg/dL (ref 8–23)
CO2: 28 mmol/L (ref 22–32)
Calcium: 8.4 mg/dL — ABNORMAL LOW (ref 8.9–10.3)
Chloride: 111 mmol/L (ref 98–111)
Creatinine, Ser: 0.58 mg/dL (ref 0.44–1.00)
GFR, Estimated: >60 mL/min (ref >60)
Glucose, Bld: 186 mg/dL — ABNORMAL HIGH (ref 70–99)
Potassium: 3.8 mmol/L (ref 3.5–5.1)
Sodium: 143 mmol/L (ref 135–145)

## 2020-11-06 MED ORDER — HYDRALAZINE HCL 20 MG/ML IJ SOLN
10.0000 mg | INTRAMUSCULAR | Status: DC | PRN
  Administered 2020-11-06 – 2020-11-08 (×3): 10 mg via INTRAVENOUS
  Filled 2020-11-06 (×3): qty 1

## 2020-11-06 MED ORDER — METOCLOPRAMIDE HCL 5 MG/ML IJ SOLN
5.0000 mg | Freq: Three times a day (TID) | INTRAMUSCULAR | Status: DC
  Administered 2020-11-06 – 2020-11-08 (×6): 5 mg via INTRAVENOUS
  Filled 2020-11-06 (×6): qty 2

## 2020-11-06 NOTE — Progress Notes (Signed)
   11/06/20 0625  Assess: MEWS Score  Temp 98.9 F (37.2 C)  BP (!) 182/89  Pulse Rate (!) 117  Resp 19  Level of Consciousness Alert  SpO2 98 %  O2 Device Room Air  Assess: MEWS Score  MEWS Temp 0  MEWS Systolic 0  MEWS Pulse 2  MEWS RR 0  MEWS LOC 0  MEWS Score 2  MEWS Score Color Yellow  Assess: if the MEWS score is Yellow or Red  Were vital signs taken at a resting state? Yes  Focused Assessment No change from prior assessment  Does the patient meet 2 or more of the SIRS criteria? Yes  Does the patient have a confirmed or suspected source of infection? No  MEWS guidelines implemented *See Row Information* Yes  Treat  MEWS Interventions Administered prn meds/treatments  Pain Scale PAINAD  Breathing 0  Negative Vocalization 0  Facial Expression 0  Body Language 0  Consolability 0  PAINAD Score 0  Take Vital Signs  Increase Vital Sign Frequency  Yellow: Q 2hr X 2 then Q 4hr X 2, if remains yellow, continue Q 4hrs  Escalate  MEWS: Escalate Yellow: discuss with charge nurse/RN and consider discussing with provider and RRT  Notify: Charge Nurse/RN  Name of Charge Nurse/RN Notified Stacy  Date Charge Nurse/RN Notified 11/06/20  Time Charge Nurse/RN Notified 0631  Notify: Provider  Provider Name/Title Prudy Feeler MD  Date Provider Notified 11/06/20  Time Provider Notified [phone]  Notification Type Page  Notification Reason Other (Comment) (Yellow MEWS)  Provider response No new orders  Date of Provider Response 11/06/20  Time of Provider Response [phone]  Assess: SIRS CRITERIA  SIRS Temperature  0  SIRS Pulse 1  SIRS Respirations  0  SIRS WBC 1  SIRS Score Sum  2

## 2020-11-06 NOTE — Consult Note (Signed)
PHARMACY CONSULT NOTE - FOLLOW UP  Pharmacy Consult for Electrolyte Monitoring and Replacement   Recent Labs: Potassium (mmol/L)  Date Value  11/06/2020 3.8  12/12/2013 3.8   Magnesium (mg/dL)  Date Value  11/04/2020 1.8   Calcium (mg/dL)  Date Value  11/06/2020 8.4 (L)   Calcium, Total (mg/dL)  Date Value  12/12/2013 8.2 (L)   Albumin (g/dL)  Date Value  11/03/2020 3.0 (L)  12/12/2013 2.7 (L)   Phosphorus (mg/dL)  Date Value  05/05/2016 3.5   Sodium (mmol/L)  Date Value  11/06/2020 143  12/12/2013 147 (H)    Assessment: 87 y.o. female with small bowel obstruction, complicated by pertinent comorbidities including UTI, severe dementia, CVA, hypertension, stage III chronic kidney disease - pharmacy has been consulted to monitor and replenish electrolytes.   Goal of Therapy:  Electrolytes wnl  Plan:  Patient remains NPO. Will continue with MIVF with potassium for now. Potassium stable. Once patient is able to start and tolerate diet, should be able to transition off of fluids.  Tawnya Crook ,PharmD Clinical Pharmacist 11/06/2020 7:51 AM

## 2020-11-06 NOTE — Progress Notes (Signed)
1       [place] at [place] NAME: [name]    MR#:  [id number]  PCP: Gareth Morgan, MD  DATE OF BIRTH:  25-Oct-1927  SUBJECTIVE:  CHIEF COMPLAINT:   Chief Complaint  Patient presents with  . Nausea  . Emesis  Patient not able to provide much history due to severe dementia.  NG tube remains in place.  Canister is full of liquid from NG tube.  Blood pressure and heart rate elevated.  Per nursing report patient had a large bowel movement this morning that 3:15 AM REVIEW OF SYSTEMS:  ROS unable to provide due to severe dementia DRUG ALLERGIES:  No Known Allergies VITALS:  Blood pressure (!) 183/92, pulse (!) 118, temperature 98.7 F (37.1 C), temperature source Oral, resp. rate 16, height 5\' 4"  (1.626 m), weight 61.3 kg, SpO2 97 %. PHYSICAL EXAMINATION:  Physical Exam 87 year old female lying in the bed without any acute discomfort HEENT: Head atraumatic, normocephalic, NG tube in place with low intermittent suction Neck supple, no jugular venous distention Lungs clear to auscultation bilaterally.  No wheezing rales rhonchi crepitation Cardiovascular S1-S2 normal, no murmur rales or gallop Abdomen soft, distended, nontender Skin no rash or lesion Neurology difficult to evaluate due to her severe dementia.  She does not communicate LABORATORY PANEL:  Female CBC Recent Labs  Lab 11/06/20 0446  WBC 13.5*  HGB 13.1  HCT 40.7  PLT 261    ------------------------------------------------------------------------------------------------------------------ Chemistries  Recent Labs  Lab 11/03/20 0939 11/04/20 0513 11/04/20 1954 11/05/20 0500 11/06/20 0446  NA 145   < > 142   < > 143  K 2.7*   < > 3.9   < > 3.8  CL 103   < > 109   < > 111  CO2 34*   < > 28   < > 28  GLUCOSE 199*   < > 133*   < > 186*  BUN 20   < > 13   < > 8  CREATININE 0.76   < > 0.60   < > 0.58  CALCIUM 8.0*   < > 8.1*   < > 8.4*  MG 2.1  --  1.8  --   --   AST 41  --   --    --   --   ALT 28  --   --   --   --   ALKPHOS 102  --   --   --   --   BILITOT 1.0  --   --   --   --    < > = values in this interval not displayed.    RADIOLOGY:  DG Abd Portable 1V-Small Bowel Obstruction Protocol-initial, 8 hr delay  Result Date: 11/05/2020 CLINICAL DATA:  Follow up small bowel obstruction at 8 hours EXAM: PORTABLE ABDOMEN - 1 VIEW COMPARISON:  11/05/2020 FINDINGS: Gastric catheter is noted in the stomach. Contrast material is noted in a distended stomach with only minimal passage of contrast into the small bowel. Persistent small bowel dilatation is noted but decreased in appearance when compared with the prior exam. No free air is noted. IMPRESSION: Decrease in the degree of small-bowel dilatation likely related to gastric catheter placement. Stomach is distended with contrast and air.  No free air is noted. Electronically Signed   By: Inez Catalina M.D.   On: 11/05/2020 21:14   ASSESSMENT AND PLAN:  87 y.o. African-American female with medical history  significant for Dementia, CVA, hypertension, PVD, stage III CKD admitted for partial small bowel obstruction with symptoms of acute onset of recurrent vomiting over the last few days.    Active Problems:   SBO (small bowel obstruction) ([place])  1.  Partial small bowel obstruction. -Continue NG tube with intermittent suction.  -Surgical team is worried about possible gastroparesis gastric ileus considering patient has had bowel movement recorded.  Abdominal x-ray pending today.  I will order IV Reglan 3 times daily to see if that helps with gastroparesis. Dr. Peyton Najjar following.  Not a good surgical candidate per his recommendation.  If she does not improve with conservative measures and we will consider palliative care  2.  Hypokalemia. - Repleted and resolved  3.  UTI with subsequent mild sepsis as manifested by tachycardia and tachypnea.. -Continue IV Rocephin.  Urine culture growing providentia stuartii and Klebsiella  pneumoniae sensitive to ceftriaxone -Continue IV normal saline and will follow blood cultures->Remains negative thus far  4.  Essential hypertension, currently uncontrolled. -Considering she is not able to keep anything oral.  Continue IV Lopressor 5 mg twice daily and  - as needed hydralazine IV (increase the dose from 5 to 10 mg) for better blood pressure control  5.  Dementia. -  no current behavioral changes. -At baseline  Body mass index is 23.2 kg/m.  Net IO Since Admission: 2,509.81 mL [11/06/20 1135]     Status is: Inpatient  Remains inpatient appropriate because:IV treatments appropriate due to intensity of illness or inability to take PO  Dispo: The patient is from: Home              Anticipated d/c is to: Home              Patient currently is not medically stable to d/c.   Difficult to place patient No   DVT prophylaxis:       enoxaparin (LOVENOX) injection 40 mg Start: 11/03/20 2200    Family Communication: (Daughter was updated over phone on 11/06/2020)   All the records are reviewed and case discussed with Care Management/Social Worker. Management plans discussed with the patient, family and they are in agreement.  CODE STATUS: DNR Level of care: Med-Surg  TOTAL TIME TAKING CARE OF THIS PATIENT: 35 minutes.   More than 50% of the time was spent in counseling/coordination of care: YES  POSSIBLE D/C IN 2-3 DAYS, DEPENDING ON CLINICAL CONDITION.   Max Sane M.D on 11/06/2020 at 11:35 AM  [facility]   CC: Primary care physician; Gareth Morgan, MD  Note: This dictation was prepared with Dragon dictation along with smaller phrase technology. Any transcriptional errors that result from this process are unintentional.

## 2020-11-06 NOTE — Plan of Care (Signed)

## 2020-11-06 NOTE — Progress Notes (Signed)
Patient ID: [name], female   DOB: Apr 29, 1928, 87 y.o.   MRN: [id number]     [place] Hospital Day(s): 3.   Interval History: Patient seen and examined, no acute events or new complaints overnight. Patient with dementia unable to give history. As per chart, patient had a large bowel movement this morning at 0315. Unable to assess pain.   Vital signs in last 24 hours: [min-max] current  Temp:  [98 F (36.7 C)-100 F (37.8 C)] 98.7 F (37.1 C) (07/10 0817) Pulse Rate:  [99-125] 118 (07/10 0817) Resp:  [16-19] 16 (07/10 0817) BP: (149-183)/(77-106) 183/92 (07/10 0817) SpO2:  [96 %-100 %] 97 % (07/10 0817)     Height: 5\' 4"  (162.6 cm) Weight: 61.3 kg BMI (Calculated): 23.19   Physical Exam:  Constitutional: awake, no distress.  Respiratory: breathing non-labored at rest  Cardiovascular: irregular rate and sinus rhythm  Gastrointestinal: soft, non-tender, and distended  Labs:  CBC Latest Ref Rng & Units 11/06/2020 11/05/2020 11/04/2020  WBC 4.0 - 10.5 K/uL 13.5(H) 11.6(H) 10.2  Hemoglobin 12.0 - 15.0 g/dL 13.1 12.4 11.7(L)  Hematocrit 36.0 - 46.0 % 40.7 39.1 36.0  Platelets 150 - 400 K/uL 261 219 211   CMP Latest Ref Rng & Units 11/06/2020 11/05/2020 11/04/2020  Glucose 70 - 99 mg/dL 186(H) 145(H) 133(H)  BUN 8 - 23 mg/dL 8 9 13   Creatinine 0.44 - 1.00 mg/dL 0.58 0.52 0.60  Sodium 135 - 145 mmol/L 143 140 142  Potassium 3.5 - 5.1 mmol/L 3.8 3.7 3.9  Chloride 98 - 111 mmol/L 111 109 109  CO2 22 - 32 mmol/L 28 26 28   Calcium 8.9 - 10.3 mg/dL 8.4(L) 8.2(L) 8.1(L)  Total Protein 6.5 - 8.1 g/dL - - -  Total Bilirubin 0.3 - 1.2 mg/dL - - -  Alkaline Phos 38 - 126 U/L - - -  AST 15 - 41 U/L - - -  ALT 0 - 44 U/L - - -    Imaging studies: Abdominal xray shows improved gas patter but with distended stomach. I personally evaluated the images.    Assessment/Plan:  87 y.o. female with small bowel obstruction, complicated by pertinent comorbidities including UTI, severe  dementia, CVA, hypertension, stage III chronic kidney disease.  Difficult patient to assess resolution of bowel obstruction. Physical exam still with distended abdomen but the degree of small bowel dilatation is decreasing on images. Most prominent dilatation is from gastric bubble. May be still with gastric ileus (gastroparesis). Will follow xray today. Continue conservative management of small bowel obstruction with NGT, IV Hydration. Continue medical management of comorbidity as per hospitalist. Will continue to follow closely.   Arnold Long, MD

## 2020-11-06 NOTE — Plan of Care (Signed)
  Problem: Education: Goal: Knowledge of General Education information will improve Description: Including pain rating scale, medication(s)/side effects and non-pharmacologic comfort measures 11/06/2020 1742 by Felipa Emory, RN Outcome: Completed/Met 11/06/2020 1742 by Felipa Emory, RN Outcome: Progressing   Problem: Health Behavior/Discharge Planning: Goal: Ability to manage health-related needs will improve 11/06/2020 1742 by Felipa Emory, RN Outcome: Completed/Met 11/06/2020 1742 by Felipa Emory, RN Outcome: Progressing   Problem: Clinical Measurements: Goal: Ability to maintain clinical measurements within normal limits will improve 11/06/2020 1742 by Felipa Emory, RN Outcome: Completed/Met 11/06/2020 1742 by Felipa Emory, RN Outcome: Progressing Goal: Will remain free from infection 11/06/2020 1742 by Felipa Emory, RN Outcome: Completed/Met 11/06/2020 1742 by Felipa Emory, RN Outcome: Progressing Goal: Diagnostic test results will improve 11/06/2020 1742 by Felipa Emory, RN Outcome: Completed/Met 11/06/2020 1742 by Felipa Emory, RN Outcome: Progressing Goal: Respiratory complications will improve 11/06/2020 1742 by Felipa Emory, RN Outcome: Completed/Met 11/06/2020 1742 by Felipa Emory, RN Outcome: Progressing Goal: Cardiovascular complication will be avoided 11/06/2020 1742 by Felipa Emory, RN Outcome: Completed/Met 11/06/2020 1742 by Felipa Emory, RN Outcome: Progressing   Problem: Activity: Goal: Risk for activity intolerance will decrease 11/06/2020 1742 by Felipa Emory, RN Outcome: Completed/Met 11/06/2020 1742 by Felipa Emory, RN Outcome: Progressing   Problem: Nutrition: Goal: Adequate nutrition will be maintained 11/06/2020 1742 by Felipa Emory, RN Outcome: Completed/Met 11/06/2020 1742 by Felipa Emory, RN Outcome: Progressing   Problem: Coping: Goal: Level  of anxiety will decrease 11/06/2020 1742 by Felipa Emory, RN Outcome: Completed/Met 11/06/2020 1742 by Felipa Emory, RN Outcome: Progressing   Problem: Elimination: Goal: Will not experience complications related to bowel motility 11/06/2020 1742 by Felipa Emory, RN Outcome: Completed/Met 11/06/2020 1742 by Felipa Emory, RN Outcome: Progressing Goal: Will not experience complications related to urinary retention 11/06/2020 1742 by Felipa Emory, RN Outcome: Completed/Met 11/06/2020 1742 by Felipa Emory, RN Outcome: Progressing   Problem: Pain Managment: Goal: General experience of comfort will improve 11/06/2020 1742 by Felipa Emory, RN Outcome: Completed/Met 11/06/2020 1742 by Felipa Emory, RN Outcome: Progressing   Problem: Safety: Goal: Ability to remain free from injury will improve 11/06/2020 1742 by Felipa Emory, RN Outcome: Completed/Met 11/06/2020 1742 by Felipa Emory, RN Outcome: Progressing   Problem: Skin Integrity: Goal: Risk for impaired skin integrity will decrease 11/06/2020 1742 by Felipa Emory, RN Outcome: Completed/Met 11/06/2020 1742 by Felipa Emory, RN Outcome: Progressing

## 2020-11-07 ENCOUNTER — Inpatient Hospital Stay: Payer: Medicare (Managed Care)

## 2020-11-07 ENCOUNTER — Encounter: Payer: Self-pay | Admitting: Family Medicine

## 2020-11-07 LAB — URINE CULTURE: Culture: >=100000 — AB

## 2020-11-07 LAB — CBC
HCT: 37.5 % (ref 36.0–46.0)
Hemoglobin: 12.1 g/dL (ref 12.0–15.0)
MCH: 28.8 pg (ref 26.0–34.0)
MCHC: 32.3 g/dL (ref 30.0–36.0)
MCV: 89.3 fL (ref 80.0–100.0)
Platelets: 264 10*3/uL (ref 150–400)
RBC: 4.2 MIL/uL (ref 3.87–5.11)
RDW: 14.3 % (ref 11.5–15.5)
WBC: 11.6 10*3/uL — ABNORMAL HIGH (ref 4.0–10.5)
nRBC: 0 % (ref 0.0–0.2)

## 2020-11-07 LAB — BASIC METABOLIC PANEL
Anion gap: 3 — ABNORMAL LOW (ref 5–15)
BUN: 9 mg/dL (ref 8–23)
CO2: 27 mmol/L (ref 22–32)
Calcium: 8.1 mg/dL — ABNORMAL LOW (ref 8.9–10.3)
Chloride: 112 mmol/L — ABNORMAL HIGH (ref 98–111)
Creatinine, Ser: 0.83 mg/dL (ref 0.44–1.00)
GFR, Estimated: >60 mL/min (ref >60)
Glucose, Bld: 162 mg/dL — ABNORMAL HIGH (ref 70–99)
Potassium: 4.4 mmol/L (ref 3.5–5.1)
Sodium: 142 mmol/L (ref 135–145)

## 2020-11-07 LAB — MAGNESIUM: Magnesium: 1.7 mg/dL (ref 1.7–2.4)

## 2020-11-07 LAB — PHOSPHORUS: Phosphorus: 2.1 mg/dL — ABNORMAL LOW (ref 2.5–4.6)

## 2020-11-07 MED ORDER — SODIUM PHOSPHATES 45 MMOLE/15ML IV SOLN
10.0000 mmol | Freq: Once | INTRAVENOUS | Status: AC
  Administered 2020-11-07: 10 mmol via INTRAVENOUS
  Filled 2020-11-07: qty 3.33

## 2020-11-07 MED ORDER — FUROSEMIDE 20 MG PO TABS
20.0000 mg | ORAL_TABLET | Freq: Every day | ORAL | Status: DC
  Administered 2020-11-07 – 2020-11-09 (×3): 20 mg via ORAL
  Filled 2020-11-07 (×3): qty 1

## 2020-11-07 MED ORDER — VITAMIN D 25 MCG (1000 UNIT) PO TABS
1000.0000 [IU] | ORAL_TABLET | Freq: Every day | ORAL | Status: DC
  Administered 2020-11-08 – 2020-11-09 (×2): 1000 [IU] via ORAL
  Filled 2020-11-07 (×2): qty 1

## 2020-11-07 MED ORDER — VITAMINS A & D EX OINT
1.0000 "application " | TOPICAL_OINTMENT | Freq: Two times a day (BID) | CUTANEOUS | Status: DC
  Administered 2020-11-07 – 2020-11-08 (×3): 1 via TOPICAL
  Filled 2020-11-07: qty 1

## 2020-11-07 MED ORDER — LACTULOSE 10 GM/15ML PO SOLN
10.0000 g | Freq: Every day | ORAL | Status: DC
  Administered 2020-11-07 – 2020-11-09 (×3): 10 g via ORAL
  Filled 2020-11-07 (×3): qty 30

## 2020-11-07 MED ORDER — ACETAMINOPHEN 500 MG PO TABS
1000.0000 mg | ORAL_TABLET | Freq: Every day | ORAL | Status: DC
  Administered 2020-11-07 – 2020-11-08 (×2): 1000 mg via ORAL
  Filled 2020-11-07 (×2): qty 2

## 2020-11-07 MED ORDER — ZINC OXIDE 20 % EX OINT
TOPICAL_OINTMENT | CUTANEOUS | Status: DC | PRN
  Filled 2020-11-07: qty 28.35

## 2020-11-07 MED ORDER — IOHEXOL 350 MG/ML SOLN
75.0000 mL | Freq: Once | INTRAVENOUS | Status: AC | PRN
  Administered 2020-11-07: 75 mL via INTRAVENOUS

## 2020-11-07 MED ORDER — TRAZODONE HCL 50 MG PO TABS
50.0000 mg | ORAL_TABLET | Freq: Every day | ORAL | Status: DC
  Administered 2020-11-07 – 2020-11-08 (×2): 50 mg via ORAL
  Filled 2020-11-07 (×2): qty 1

## 2020-11-07 MED ORDER — MENTHOL-ZINC OXIDE 0.44-20.6 % EX OINT: 1.0000 "application " | TOPICAL_OINTMENT | CUTANEOUS | Status: DC | PRN

## 2020-11-07 MED ORDER — NYSTATIN 100000 UNIT/GM EX POWD
1.0000 "application " | Freq: Two times a day (BID) | CUTANEOUS | Status: DC | PRN
  Filled 2020-11-07: qty 15

## 2020-11-07 MED ORDER — BISACODYL 10 MG RE SUPP: 10.0000 mg | Freq: Every day | RECTAL | Status: DC | PRN

## 2020-11-07 MED ORDER — ZINC OXIDE 40 % EX OINT
1.0000 "application " | TOPICAL_OINTMENT | Freq: Three times a day (TID) | CUTANEOUS | Status: DC
  Administered 2020-11-07 – 2020-11-09 (×5): 1 via TOPICAL
  Filled 2020-11-07: qty 113

## 2020-11-07 MED ORDER — FUROSEMIDE 10 MG/ML IJ SOLN
40.0000 mg | Freq: Once | INTRAMUSCULAR | Status: AC
  Administered 2020-11-07: 40 mg via INTRAVENOUS
  Filled 2020-11-07: qty 4

## 2020-11-07 MED ORDER — METOPROLOL TARTRATE 5 MG/5ML IV SOLN
5.0000 mg | INTRAVENOUS | Status: AC
  Administered 2020-11-07: 5 mg via INTRAVENOUS
  Filled 2020-11-07: qty 5

## 2020-11-07 MED ORDER — DEXTROSE-NACL 5-0.45 % IV SOLN: INTRAVENOUS | Status: DC

## 2020-11-07 MED ORDER — LIDOCAINE 5 % EX PTCH
1.0000 | MEDICATED_PATCH | CUTANEOUS | Status: DC
  Administered 2020-11-07: 1 via TRANSDERMAL
  Filled 2020-11-07 (×3): qty 1

## 2020-11-07 NOTE — Progress Notes (Signed)
Patient ID: [name], female   DOB: 1928-03-29, 87 y.o.   MRN: [id number]      [facility] Day(s): 4.   Interval History: Patient seen and reexamined. Patient tolerated clear liquid diet.  As per nurse patient ate 100% of lunch tray.  No sign of nausea or vomiting has been identified.  Vital signs in last 24 hours: [min-max] current  Temp:  [98.8 F (37.1 C)-99.5 F (37.5 C)] 98.8 F (37.1 C) (07/11 1614) Pulse Rate:  [59-137] 137 (07/11 1721) Resp:  [16-20] 18 (07/11 1721) BP: (132-173)/(65-109) 163/65 (07/11 1721) SpO2:  [94 %-100 %] 100 % (07/11 1721)     Height: 5\' 4"  (162.6 cm) Weight: 61.3 kg BMI (Calculated): 23.19   Physical Exam:  Constitutional: Wake and no distress  Respiratory: breathing non-labored at rest  Cardiovascular: Irregular rate and sinus rhythm  Gastrointestinal: soft, non-tender, and non-distended  Labs:  CBC Latest Ref Rng & Units 11/07/2020 11/06/2020 11/05/2020  WBC 4.0 - 10.5 K/uL 11.6(H) 13.5(H) 11.6(H)  Hemoglobin 12.0 - 15.0 g/dL 12.1 13.1 12.4  Hematocrit 36.0 - 46.0 % 37.5 40.7 39.1  Platelets 150 - 400 K/uL 264 261 219   CMP Latest Ref Rng & Units 11/07/2020 11/06/2020 11/05/2020  Glucose 70 - 99 mg/dL 162(H) 186(H) 145(H)  BUN 8 - 23 mg/dL 9 8 9   Creatinine 0.44 - 1.00 mg/dL 0.83 0.58 0.52  Sodium 135 - 145 mmol/L 142 143 140  Potassium 3.5 - 5.1 mmol/L 4.4 3.8 3.7  Chloride 98 - 111 mmol/L 112(H) 111 109  CO2 22 - 32 mmol/L 27 28 26   Calcium 8.9 - 10.3 mg/dL 8.1(L) 8.4(L) 8.2(L)  Total Protein 6.5 - 8.1 g/dL - - -  Total Bilirubin 0.3 - 1.2 mg/dL - - -  Alkaline Phos 38 - 126 U/L - - -  AST 15 - 41 U/L - - -  ALT 0 - 44 U/L - - -    Imaging studies: No new pertinent imaging studies   Assessment/Plan:  And reevaluated.  She tolerated clear liquid diet.  Abdomen is soft and nondistended.  We will remove NGT and advance diet to full liquids.  These follow-up encounter was more than 30-minute movement time  coordinating plan of care.  Arnold Long, MD

## 2020-11-07 NOTE — Progress Notes (Addendum)
1       [facility] [place] NAME: [name]    MR#:  [id number]  PCP: Gareth Morgan, MD  DATE OF BIRTH:  08-24-1927  SUBJECTIVE:  CHIEF COMPLAINT:   Chief Complaint  Patient presents with   Nausea   Emesis  Patient not able to provide much history due to severe dementia.  Nursing reported multiple bowel movements yesterday.  Patient did not have any issues with NG tube clamping overnight per report. Some gurgling in chest  REVIEW OF SYSTEMS:  ROS unable to provide due to severe dementia DRUG ALLERGIES:  No Known Allergies VITALS:  Blood pressure 136/84, pulse 98, temperature 99.2 F (37.3 C), temperature source Axillary, resp. rate 20, height 5\' 4"  (1.626 m), weight 61.3 kg, SpO2 94 %. PHYSICAL EXAMINATION:  Physical Exam 87 year old female lying in the bed without any acute discomfort HEENT: Head atraumatic, normocephalic, NG tube clamped Neck supple, no jugular venous distention Lungs rales at bases.  No wheezing rhonchi crepitation Cardiovascular S1-S2 normal, no murmur rales or gallop Abdomen soft, distended, nontender Skin no rash or lesion Neurology difficult to evaluate due to her severe dementia.  She does not communicate LABORATORY PANEL:  Female CBC Recent Labs  Lab 11/07/20 0434  WBC 11.6*  HGB 12.1  HCT 37.5  PLT 264    ------------------------------------------------------------------------------------------------------------------ Chemistries  Recent Labs  Lab 11/03/20 0939 11/04/20 0513 11/07/20 0434  NA 145   < > 142  K 2.7*   < > 4.4  CL 103   < > 112*  CO2 34*   < > 27  GLUCOSE 199*   < > 162*  BUN 20   < > 9  CREATININE 0.76   < > 0.83  CALCIUM 8.0*   < > 8.1*  MG 2.1   < > 1.7  AST 41  --   --   ALT 28  --   --   ALKPHOS 102  --   --   BILITOT 1.0  --   --    < > = values in this interval not displayed.    RADIOLOGY:  DG Abd 1 View  Result Date: 11/07/2020 CLINICAL DATA:  Abdominal distension.  EXAM: ABDOMEN - 1 VIEW COMPARISON:  11/06/20 FINDINGS: The nasogastric tube is in the proximal stomach. Retained contrast material noted within gastric fundus. Persistent gastric distension with dilated loops of small bowel. Stool mixed with enteric contrast material noted scattered within the colon and rectum. IMPRESSION: 1. No signs of high-grade bowel obstruction. 2. Persistent small bowel dilatation compatible with either partial obstruction versus ileus Electronically Signed   By: Kerby Moors M.D.   On: 11/07/2020 08:39   ASSESSMENT AND PLAN:  87 y.o. African-American female with medical history significant for Dementia, CVA, hypertension, PVD, stage III CKD admitted for partial small bowel obstruction with symptoms of acute onset of recurrent vomiting over the last few days.    Active Problems:   SBO (small bowel obstruction) ([place])  1.  Partial small bowel obstruction. -Continue NG tube clamped.  Starting clear liquid diet today per surgery recommendation -Abdominal x-ray shows improvement -Continue IV Reglan 3 times daily to see if that helps with gastroparesis. - Dr. Peyton Najjar following.  Not a good surgical candidate per his recommendation.   2.  Hypokalemia. - Repleted and resolved  3.  UTI with subsequent mild sepsis as manifested by tachycardia and tachypnea.. - Stop IV Rocephin today.  Urine culture growing  providentia stuartii and Klebsiella pneumoniae sensitive to ceftriaxone - Completed 3 days course of IV antibiotics.  Patient does have a low-grade fever.  Monitor for now  4.  Essential hypertension, currently uncontrolled. - Blood pressure much better control.  Continue IV hydralazine and Lopressor and adjust as needed  5.  Dementia. -  no current behavioral changes. -At baseline  6. Dyspnea - will obtain CXR in am. She has likely some fluid built up from several days of IV hydration and not ambulating. Net IO Since Admission: 4,240.36 mL [11/07/20 1325]  - will give  40 mg IV lasix once for now and monitor I & Os  Goals of care: She is DNR and followed by [facility] Palliative care consult  Body mass index is 23.2 kg/m.     Status is: Inpatient  Remains inpatient appropriate because:IV treatments appropriate due to intensity of illness or inability to take PO still has NG tube and waiting to have resolution of SBO and tolerance of diet  Dispo: The patient is from: Home              Anticipated d/c is to: Home              Patient currently is not medically stable to d/c.   Difficult to place patient No   DVT prophylaxis:       enoxaparin (LOVENOX) injection 40 mg Start: 11/03/20 2200    Family Communication: (Daughter was updated over phone on 11/07/2020)   All the records are reviewed and case discussed with Care Management/Social Worker. Management plans discussed with the patient, family and they are in agreement.  CODE STATUS: DNR Level of care: Med-Surg  TOTAL TIME TAKING CARE OF THIS PATIENT: 35 minutes.   More than 50% of the time was spent in counseling/coordination of care: YES  POSSIBLE D/C IN 1-2 DAYS, DEPENDING ON CLINICAL CONDITION.   Max Sane M.D on 11/07/2020 at 1:14 PM  [facility]   CC: Primary care physician; Gareth Morgan, MD  Note: This dictation was prepared with Dragon dictation along with smaller phrase technology. Any transcriptional errors that result from this process are unintentional.

## 2020-11-07 NOTE — Plan of Care (Signed)

## 2020-11-07 NOTE — Progress Notes (Signed)
Patient ID: [name], female   DOB: [birth year], 87 y.o.   MRN: [id number]     [facility] Day(s): 4.   Interval History: Patient seen and examined, no acute events or new complaints overnight.  As per nurse the patient had multiple bowel movement yesterday.  No sign of nausea despite having the NGT clamped overnight.  Unable to assess pain.  Vital signs in last 24 hours: [min-max] current  Temp:  [98.7 F (37.1 C)-99.5 F (37.5 C)] 99.2 F (37.3 C) (07/11 0724) Pulse Rate:  [59-129] 98 (07/11 0724) Resp:  [18-20] 20 (07/11 0724) BP: (132-173)/(84-109) 136/84 (07/11 0724) SpO2:  [94 %-100 %] 94 % (07/11 0724)     Height: 5\' 4"  (162.6 cm) Weight: 61.3 kg BMI (Calculated): 23.19   Physical Exam:  Constitutional: alert, cooperative and no distress  Respiratory: breathing non-labored at rest  Cardiovascular: regular rate and sinus rhythm  Gastrointestinal: soft, non-tender, and non-distended  Labs:  CBC Latest Ref Rng & Units 11/07/2020 11/06/2020 11/05/2020  WBC 4.0 - 10.5 K/uL 11.6(H) 13.5(H) 11.6(H)  Hemoglobin 12.0 - 15.0 g/dL 12.1 13.1 12.4  Hematocrit 36.0 - 46.0 % 37.5 40.7 39.1  Platelets 150 - 400 K/uL 264 261 219   CMP Latest Ref Rng & Units 11/07/2020 11/06/2020 11/05/2020  Glucose 70 - 99 mg/dL 162(H) 186(H) 145(H)  BUN 8 - 23 mg/dL 9 8 9   Creatinine 0.44 - 1.00 mg/dL 0.83 0.58 0.52  Sodium 135 - 145 mmol/L 142 143 140  Potassium 3.5 - 5.1 mmol/L 4.4 3.8 3.7  Chloride 98 - 111 mmol/L 112(H) 111 109  CO2 22 - 32 mmol/L 27 28 26   Calcium 8.9 - 10.3 mg/dL 8.1(L) 8.4(L) 8.2(L)  Total Protein 6.5 - 8.1 g/dL - - -  Total Bilirubin 0.3 - 1.2 mg/dL - - -  Alkaline Phos 38 - 126 U/L - - -  AST 15 - 41 U/L - - -  ALT 0 - 44 U/L - - -    Imaging studies: Abdominal x-ray shows continue improving small bowel dilatation.  Contrast in rectum.  I personally evaluated the images.   Assessment/Plan:  87 y.o. female with small bowel obstruction, complicated by  pertinent comorbidities including UTI, severe dementia, CVA, hypertension, stage III chronic kidney disease.  Small bowel obstruction seems to be resolving slowly.  Abdominal x-ray showed improvement of the small bowel dilatation with contrast in the colon.  Since patient is unable to give any complaint of nausea I will start with clear liquid diet with NGT in place.  If he tolerates will consider removing NGT later.  If she gets nauseous or has any episode of vomiting she will be able to be placed back to low intermittent suction.  We will try to continue conservative management.  We will continue to follow closely.  Arnold Long, MD

## 2020-11-07 NOTE — Progress Notes (Signed)
Mobility Specialist - Progress Note   11/07/20 1640  Mobility  Activity Contraindicated/medical hold  Mobility performed by Mobility specialist    Medical issues prohibiting mobility. Pt with elevated HR of 134 bpm upon arrival, resting in bed at this time. No s/s of distress. RN notified immediately. Will attempt session another date/time as appropriate.    Kathee Delton Mobility Specialist 11/07/20, 4:41 PM

## 2020-11-07 NOTE — Consult Note (Addendum)
PHARMACY CONSULT NOTE - FOLLOW UP  Pharmacy Consult for Electrolyte Monitoring and Replacement   Recent Labs: Potassium (mmol/L)  Date Value  11/07/2020 4.4  12/12/2013 3.8   Magnesium (mg/dL)  Date Value  11/07/2020 1.7   Calcium (mg/dL)  Date Value  11/07/2020 8.1 (L)   Calcium, Total (mg/dL)  Date Value  12/12/2013 8.2 (L)   Albumin (g/dL)  Date Value  11/03/2020 3.0 (L)  12/12/2013 2.7 (L)   Phosphorus (mg/dL)  Date Value  11/07/2020 2.1 (L)   Sodium (mmol/L)  Date Value  11/07/2020 142  12/12/2013 147 (H)    Corr Ca: 9 mg/dL  Assessment: 87 y.o. female with small bowel obstruction, complicated by pertinent comorbidities including UTI, severe dementia, CVA, hypertension, stage III chronic kidney disease - pharmacy has been consulted to monitor and replenish electrolytes.  K 3.8 >4.4. On MIVF with Kcl @ 171ml/hr. Once patient is able to start and tolerate diet, should be able to transition off of fluids.  Goal of Therapy:  Electrolytes wnl  Plan:  --K 4.4 WNL, will remove KCl from MIVF  --Phos 2.1. Will replace with 10 mmol IV Na phosphate --Follow up electrolytes with AM labs   Sherilyn Banker ,PharmD Clinical Pharmacist 11/07/2020 8:36 AM

## 2020-11-07 NOTE — Progress Notes (Signed)
Nursing reported persistent tachycardia. Will obtain CT chest to r/o PE. Give lopressor 5 mg once

## 2020-11-08 ENCOUNTER — Inpatient Hospital Stay: Payer: Medicare (Managed Care)

## 2020-11-08 DIAGNOSIS — R0600 Dyspnea, unspecified: Secondary | ICD-10-CM

## 2020-11-08 DIAGNOSIS — R140 Abdominal distension (gaseous): Secondary | ICD-10-CM

## 2020-11-08 DIAGNOSIS — R0609 Other forms of dyspnea: Secondary | ICD-10-CM

## 2020-11-08 DIAGNOSIS — J90 Pleural effusion, not elsewhere classified: Secondary | ICD-10-CM

## 2020-11-08 LAB — CULTURE, BLOOD (ROUTINE X 2)
Culture: NO GROWTH
Culture: NO GROWTH
Special Requests: ADEQUATE

## 2020-11-08 LAB — BASIC METABOLIC PANEL
Anion gap: 5 (ref 5–15)
BUN: 7 mg/dL — ABNORMAL LOW (ref 8–23)
CO2: 27 mmol/L (ref 22–32)
Calcium: 8 mg/dL — ABNORMAL LOW (ref 8.9–10.3)
Chloride: 107 mmol/L (ref 98–111)
Creatinine, Ser: 0.61 mg/dL (ref 0.44–1.00)
GFR, Estimated: >60 mL/min (ref >60)
Glucose, Bld: 132 mg/dL — ABNORMAL HIGH (ref 70–99)
Potassium: 3.5 mmol/L (ref 3.5–5.1)
Sodium: 139 mmol/L (ref 135–145)

## 2020-11-08 LAB — BODY FLUID CELL COUNT WITH DIFFERENTIAL
Eos, Fluid: 0 %
Lymphs, Fluid: 65 %
Monocyte-Macrophage-Serous Fluid: 26 %
Neutrophil Count, Fluid: 9 %
Total Nucleated Cell Count, Fluid: 1667 cu mm

## 2020-11-08 LAB — CBC
HCT: 35.9 % — ABNORMAL LOW (ref 36.0–46.0)
Hemoglobin: 11.6 g/dL — ABNORMAL LOW (ref 12.0–15.0)
MCH: 29.3 pg (ref 26.0–34.0)
MCHC: 32.3 g/dL (ref 30.0–36.0)
MCV: 90.7 fL (ref 80.0–100.0)
Platelets: 238 10*3/uL (ref 150–400)
RBC: 3.96 MIL/uL (ref 3.87–5.11)
RDW: 14.2 % (ref 11.5–15.5)
WBC: 9.5 10*3/uL (ref 4.0–10.5)
nRBC: 0 % (ref 0.0–0.2)

## 2020-11-08 LAB — PROCALCITONIN: Procalcitonin: <0.1 ng/mL

## 2020-11-08 LAB — PROTEIN, PLEURAL OR PERITONEAL FLUID: Total protein, fluid: 3.3 g/dL

## 2020-11-08 LAB — PHOSPHORUS: Phosphorus: 3.3 mg/dL (ref 2.5–4.6)

## 2020-11-08 LAB — MAGNESIUM: Magnesium: 1.7 mg/dL (ref 1.7–2.4)

## 2020-11-08 LAB — LACTATE DEHYDROGENASE, PLEURAL OR PERITONEAL FLUID: LD, Fluid: 116 U/L — ABNORMAL HIGH (ref 3–23)

## 2020-11-08 LAB — BRAIN NATRIURETIC PEPTIDE: B Natriuretic Peptide: 55.1 pg/mL (ref 0.0–100.0)

## 2020-11-08 MED ORDER — POTASSIUM CHLORIDE CRYS ER 20 MEQ PO TBCR
40.0000 meq | EXTENDED_RELEASE_TABLET | Freq: Once | ORAL | Status: AC
  Administered 2020-11-08: 40 meq via ORAL
  Filled 2020-11-08: qty 2

## 2020-11-08 MED ORDER — METOPROLOL TARTRATE 25 MG PO TABS
25.0000 mg | ORAL_TABLET | Freq: Two times a day (BID) | ORAL | Status: DC
  Administered 2020-11-08 – 2020-11-09 (×2): 25 mg via ORAL
  Filled 2020-11-08 (×2): qty 1

## 2020-11-08 NOTE — Consult Note (Signed)
PHARMACY CONSULT NOTE - FOLLOW UP  Pharmacy Consult for Electrolyte Monitoring and Replacement   Recent Labs: Potassium (mmol/L)  Date Value  11/08/2020 3.5  12/12/2013 3.8   Magnesium (mg/dL)  Date Value  11/08/2020 1.7   Calcium (mg/dL)  Date Value  11/08/2020 8.0 (L)   Calcium, Total (mg/dL)  Date Value  12/12/2013 8.2 (L)   Albumin (g/dL)  Date Value  11/03/2020 3.0 (L)  12/12/2013 2.7 (L)   Phosphorus (mg/dL)  Date Value  11/08/2020 3.3   Sodium (mmol/L)  Date Value  11/08/2020 139  12/12/2013 147 (H)   Corr Ca: 8.8 mg/dL  Assessment: 87 y.o. female with small bowel obstruction, complicated by pertinent comorbidities including UTI, severe dementia, CVA, hypertension, stage III chronic kidney disease - pharmacy has been consulted to monitor and replenish electrolytes.  K 3.8 >4.4>3.5. on MIVF (D5W1/2NS). Once patient is able to start and tolerate diet, should be able to transition off of fluids.  Goal of Therapy:  Electrolytes wnl  Plan:  --K 3.5, however trended down from 4.4. Will order 40 mEq PO Kcl x1 to avoid low K tomorrow.  --Follow up electrolytes with AM labs   Sherilyn Banker ,PharmD Clinical Pharmacist 11/08/2020 7:05 AM

## 2020-11-08 NOTE — Progress Notes (Signed)
[facility] at [place] NAME: [name]    MR#:  [id number]  DATE OF BIRTH:  [birth year]  SUBJECTIVE:   patient has advanced dementia unable to give any history review of systems. Met with daughter earlier. Tolerating full liquid diet. No nausea vomiting. Heart rate much improved. No respiratory distress noted. REVIEW OF SYSTEMS:   Review of Systems  Unable to perform ROS: Dementia  Tolerating Diet: Tolerating PT: bed bound  DRUG ALLERGIES:  No Known Allergies  VITALS:  Blood pressure 106/64, pulse 88, temperature 98.9 F (37.2 C), resp. rate 18, height _0  (1.626 m), weight 61.3 kg, SpO2 98 %.  PHYSICAL EXAMINATION:   Physical Exam  87 year old female lying in the bed without any acute discomfort HEENT: Head atraumatic, normocephalic Neck supple, no jugular venous distention Lungs rales at bases.  No wheezing rhonchi crepitation Cardiovascular S1-S2 normal, no murmur rales or gallop Abdomen soft, distended, nontender Skin: no rash or lesion Neurology: difficult to evaluate due to her severe dementia.  She does not communicate LABORATORY PANEL:  CBC Recent Labs  Lab 11/08/20 0544  WBC 9.5  HGB 11.6*  HCT 35.9*  PLT 238    Chemistries  Recent Labs  Lab 11/03/20 0939 11/04/20 0513 11/08/20 0544  NA 145   < > 139  K 2.7*   < > 3.5  CL 103   < > 107  CO2 34*   < > 27  GLUCOSE 199*   < > 132*  BUN 20   < > 7*  CREATININE 0.76   < > 0.61  CALCIUM 8.0*   < > 8.0*  MG 2.1   < > 1.7  AST 41  --   --   ALT 28  --   --   ALKPHOS 102  --   --   BILITOT 1.0  --   --    < > = values in this interval not displayed.   Cardiac Enzymes No results for input(s): TROPONINI in the last 168 hours. RADIOLOGY:  DG Abd 1 View  Result Date: 11/07/2020 CLINICAL DATA:  Abdominal distension. EXAM: ABDOMEN - 1 VIEW COMPARISON:  11/06/20 FINDINGS: The nasogastric tube is in the proximal stomach. Retained contrast material noted within  gastric fundus. Persistent gastric distension with dilated loops of small bowel. Stool mixed with enteric contrast material noted scattered within the colon and rectum. IMPRESSION: 1. No signs of high-grade bowel obstruction. 2. Persistent small bowel dilatation compatible with either partial obstruction versus ileus Electronically Signed   By: Kerby Moors M.D.   On: 11/07/2020 08:39   CT Angio Chest Pulmonary Embolism (PE) W or WO Contrast  Result Date: 11/07/2020 CLINICAL DATA:  Tachycardia EXAM: CT ANGIOGRAPHY CHEST WITH CONTRAST TECHNIQUE: Multidetector CT imaging of the chest was performed using the standard protocol during bolus administration of intravenous contrast. Multiplanar CT image reconstructions and MIPs were obtained to evaluate the vascular anatomy. CONTRAST:  33m OMNIPAQUE IOHEXOL 350 MG/ML SOLN COMPARISON:  None. FINDINGS: Cardiovascular: Thoracic aorta and its branches demonstrate atherosclerotic calcifications without aneurysmal dilatation or dissection. No cardiac enlargement is seen. Coronary calcifications are noted. The pulmonary artery shows a normal branching pattern. No focal filling defect to suggest pulmonary embolism is identified. Mediastinum/Nodes: Thoracic inlet is within normal limits. No sizable hilar or mediastinal adenopathy is noted. The esophagus is somewhat air-filled but otherwise within normal limits. Lungs/Pleura: Moderate to large bilateral pleural effusions are seen. Underlying lower lobe consolidation  is noted. No parenchymal nodules are seen. No pneumothorax is identified. Upper Abdomen: Visualized upper abdomen demonstrates contrast with stomach. No other focal abnormality is noted. Musculoskeletal: Degenerative changes of the thoracic spine are seen. No rib abnormality is noted. Review of the MIP images confirms the above findings. IMPRESSION: No evidence of pulmonary emboli. Bilateral large pleural effusions with associated lower lobe consolidation. These  changes have increased in the interval from the prior exam. No other focal abnormality is noted. Aortic Atherosclerosis (ICD10-I70.0). Electronically Signed   By: Inez Catalina M.D.   On: 11/07/2020 19:48   DG Chest Port 1 View  Result Date: 11/08/2020 CLINICAL DATA:  Cough EXAM: PORTABLE CHEST 1 VIEW COMPARISON:  04/30/2016 FINDINGS: The patient has taken a poor inspiration. Heart size is within normal limits. Chronic aortic atherosclerosis is seen. There is volume loss and probable infiltrate in both lower lobes. Upper lungs appear clear. No evidence of edema. No acute bone finding. IMPRESSION: Bilateral lower lobe atelectasis/pneumonia. Electronically Signed   By: Nelson Chimes M.D.   On: 11/08/2020 07:55   ASSESSMENT AND PLAN:   87 y.o. African-American female with medical history significant for Dementia, CVA, hypertension, PVD, stage III CKD admitted for partial small bowel obstruction with symptoms of acute onset of recurrent vomiting over the last few days.      Partial small bowel obstruction. -NG tube removed.  Starting clear liquid diet today per surgery recommendation -Abdominal x-ray shows improvement -- Dr. Peyton Najjar following.  Not a good surgical candidate per his recommendation.  --7/12--tolerating po FLD  Tachycardia and Dyspnea due to Right Pleural effusion --cxr and CT chest showed right-sided pleural effusion with questionable infiltrate. Clinically does not have symptoms of pneumonia. BNP and Pro calcitonin within normal limit. -- will get ultrasound-guided thoracentesis spoke with interventional radiology. Discussed with daughter and agreeable with plan. Patient not exhibiting any sign symptoms of respiratory distress today and heart rate much improved.  Hypokalemia. - Repleted and resolved  UTI with subsequent mild sepsis as manifested by tachycardia and tachypnea.. - Urine culture growing providentia stuartii and Klebsiella pneumoniae sensitive to ceftriaxone - Completed  3 days course of IV antibiotics.  Patient does have a low-grade fever.    Essential hypertension - Blood pressure much better control with metoprolol and felodipine - Continue IV hydralazine prn    Dementia. -  no current behavioral changes. -At baseline     Goals of care: She is DNR and followed by [facility] Palliative care consult       Status is: Inpatient     Dispo: The patient is from: Home              Anticipated d/c is to: Home with [facility] on 11/09/2020              Patient currently is improving  table to d/c.              Difficult to place patient No          TOTAL TIME TAKING CARE OF THIS PATIENT: 25 minutes.  >50% time spent on counselling and coordination of care  Note: This dictation was prepared with Dragon dictation along with smaller phrase technology. Any transcriptional errors that result from this process are unintentional.  Fritzi Mandes M.D    [facility]   CC: Primary care physician; Marie Morgan, MD Patient ID: [name], female   DOB: 05-22-1927, 87 y.o.   MRN: [id number]

## 2020-11-08 NOTE — Procedures (Signed)
PROCEDURE SUMMARY:  Successful image-guided right thoracentesis. Yielded 275 mL liters of clear amber fluid. Patient tolerated procedure well. EBL < 1 mL No immediate complications.  Specimen was sent for labs. Post procedure CXR shows no pneumothorax.  Please see imaging section of Epic for full dictation.  Joaquim Nam PA-C 11/08/2020 3:23 PM

## 2020-11-08 NOTE — Clinical Social Work Note (Signed)
Spoke with Juliann Pulse at [facility] to notify her of plan for discharge home tomorrow. She said they will take her home but patient will need lift to get into her wheelchair. [facility] and also has aide services.  Dayton Scrape, [place]

## 2020-11-08 NOTE — Progress Notes (Signed)
Patient ID: [name], female   DOB: 11-May-1927, 87 y.o.   MRN: [id number]     [facility] Day(s): 5.   Interval History: Patient seen and examined, no acute events or new complaints overnight.  Patient unable to give history due to advanced dementia.  As per nurse patient had 2 bowel movement yesterday.  She was able to eat some full liquid diet at supper.  There is no sign of nausea or vomiting.  Unable to assess pain.  Vital signs in last 24 hours: [min-max] current  Temp:  [98 F (36.7 C)-100 F (37.8 C)] 98.1 F (36.7 C) (07/12 0446) Pulse Rate:  [102-137] 109 (07/12 0446) Resp:  [16-20] 20 (07/12 0446) BP: (132-167)/(65-75) 159/75 (07/12 0446) SpO2:  [97 %-100 %] 97 % (07/12 0446)     Height: 5\' 4"  (162.6 cm) Weight: 61.3 kg BMI (Calculated): 23.19   Physical Exam:  Constitutional: No distress  Respiratory: breathing non-labored at rest  Cardiovascular: Irregular rate and sinus rhythm  Gastrointestinal: soft, non-tender, and non-distended  Labs:  CBC Latest Ref Rng & Units 11/08/2020 11/07/2020 11/06/2020  WBC 4.0 - 10.5 K/uL 9.5 11.6(H) 13.5(H)  Hemoglobin 12.0 - 15.0 g/dL 11.6(L) 12.1 13.1  Hematocrit 36.0 - 46.0 % 35.9(L) 37.5 40.7  Platelets 150 - 400 K/uL 238 264 261   CMP Latest Ref Rng & Units 11/08/2020 11/07/2020 11/06/2020  Glucose 70 - 99 mg/dL 132(H) 162(H) 186(H)  BUN 8 - 23 mg/dL 7(L) 9 8  Creatinine 0.44 - 1.00 mg/dL 0.61 0.83 0.58  Sodium 135 - 145 mmol/L 139 142 143  Potassium 3.5 - 5.1 mmol/L 3.5 4.4 3.8  Chloride 98 - 111 mmol/L 107 112(H) 111  CO2 22 - 32 mmol/L 27 27 28   Calcium 8.9 - 10.3 mg/dL 8.0(L) 8.1(L) 8.4(L)  Total Protein 6.5 - 8.1 g/dL - - -  Total Bilirubin 0.3 - 1.2 mg/dL - - -  Alkaline Phos 38 - 126 U/L - - -  AST 15 - 41 U/L - - -  ALT 0 - 44 U/L - - -    Imaging studies: I personally evaluated the images of the chest CT scan.  No sign of PE.  Still oral contrast in the stomach.   Assessment/Plan:  87 y.o.  female with small bowel obstruction, complicated by pertinent comorbidities including UTI, severe dementia, CVA, hypertension, stage III chronic kidney disease.  Patient today with no sign of nausea or vomiting.  Seems to be tolerating full liquids.  Will assess full liquid toleration from breakfast.  If she tolerates she can be advanced to soft diet.  Continue medical management of other comorbidities such as tachycardia, gastroparesis, UTI.  Electrolytes currently within normal limits.  Adequate renal function.  We will continue to follow.  Arnold Long, MD

## 2020-11-08 NOTE — Plan of Care (Signed)
  Problem: Education: Goal: Knowledge of General Education information will improve Description: Including pain rating scale, medication(s)/side effects and non-pharmacologic comfort measures 11/08/2020 1615 by Felipa Emory, RN Outcome: Progressing 11/08/2020 0746 by Felipa Emory, RN Outcome: Progressing   Problem: Health Behavior/Discharge Planning: Goal: Ability to manage health-related needs will improve 11/08/2020 1615 by Felipa Emory, RN Outcome: Progressing 11/08/2020 0746 by Felipa Emory, RN Outcome: Progressing   Problem: Clinical Measurements: Goal: Ability to maintain clinical measurements within normal limits will improve 11/08/2020 1615 by Felipa Emory, RN Outcome: Progressing 11/08/2020 0746 by Felipa Emory, RN Outcome: Progressing Goal: Will remain free from infection 11/08/2020 1615 by Felipa Emory, RN Outcome: Progressing 11/08/2020 0746 by Felipa Emory, RN Outcome: Progressing Goal: Diagnostic test results will improve 11/08/2020 1615 by Felipa Emory, RN Outcome: Progressing 11/08/2020 0746 by Felipa Emory, RN Outcome: Progressing Goal: Respiratory complications will improve 11/08/2020 1615 by Felipa Emory, RN Outcome: Progressing 11/08/2020 0746 by Felipa Emory, RN Outcome: Progressing Goal: Cardiovascular complication will be avoided 11/08/2020 1615 by Felipa Emory, RN Outcome: Progressing 11/08/2020 0746 by Felipa Emory, RN Outcome: Progressing   Problem: Activity: Goal: Risk for activity intolerance will decrease 11/08/2020 1615 by Felipa Emory, RN Outcome: Progressing 11/08/2020 0746 by Felipa Emory, RN Outcome: Progressing   Problem: Nutrition: Goal: Adequate nutrition will be maintained 11/08/2020 1615 by Felipa Emory, RN Outcome: Progressing 11/08/2020 0746 by Felipa Emory, RN Outcome: Progressing   Problem: Coping: Goal: Level of anxiety will  decrease 11/08/2020 1615 by Felipa Emory, RN Outcome: Progressing 11/08/2020 0746 by Felipa Emory, RN Outcome: Progressing   Problem: Elimination: Goal: Will not experience complications related to bowel motility 11/08/2020 1615 by Felipa Emory, RN Outcome: Progressing 11/08/2020 0746 by Felipa Emory, RN Outcome: Progressing Goal: Will not experience complications related to urinary retention 11/08/2020 1615 by Felipa Emory, RN Outcome: Progressing 11/08/2020 0746 by Felipa Emory, RN Outcome: Progressing   Problem: Pain Managment: Goal: General experience of comfort will improve 11/08/2020 1615 by Felipa Emory, RN Outcome: Progressing 11/08/2020 0746 by Felipa Emory, RN Outcome: Progressing   Problem: Safety: Goal: Ability to remain free from injury will improve 11/08/2020 1615 by Felipa Emory, RN Outcome: Progressing 11/08/2020 0746 by Felipa Emory, RN Outcome: Progressing   Problem: Skin Integrity: Goal: Risk for impaired skin integrity will decrease 11/08/2020 1615 by Felipa Emory, RN Outcome: Progressing 11/08/2020 0746 by Felipa Emory, RN Outcome: Progressing

## 2020-11-08 NOTE — Plan of Care (Signed)

## 2020-11-09 DIAGNOSIS — Z9889 Other specified postprocedural states: Secondary | ICD-10-CM

## 2020-11-09 LAB — PHOSPHORUS: Phosphorus: 4.4 mg/dL (ref 2.5–4.6)

## 2020-11-09 LAB — BASIC METABOLIC PANEL
Anion gap: 7 (ref 5–15)
BUN: 8 mg/dL (ref 8–23)
CO2: 24 mmol/L (ref 22–32)
Calcium: 8 mg/dL — ABNORMAL LOW (ref 8.9–10.3)
Chloride: 107 mmol/L (ref 98–111)
Creatinine, Ser: 0.69 mg/dL (ref 0.44–1.00)
GFR, Estimated: >60 mL/min (ref >60)
Glucose, Bld: 99 mg/dL (ref 70–99)
Potassium: 3.6 mmol/L (ref 3.5–5.1)
Sodium: 138 mmol/L (ref 135–145)

## 2020-11-09 LAB — MAGNESIUM: Magnesium: 1.7 mg/dL (ref 1.7–2.4)

## 2020-11-09 MED ORDER — POLYETHYLENE GLYCOL 3350 17 G PO PACK: 17.0000 g | PACK | Freq: Every day | ORAL | 0 refills | Status: DC | PRN

## 2020-11-09 MED ORDER — MAGNESIUM SULFATE 2 GM/50ML IV SOLN
2.0000 g | Freq: Once | INTRAVENOUS | Status: AC
  Administered 2020-11-09: 2 g via INTRAVENOUS
  Filled 2020-11-09: qty 50

## 2020-11-09 MED ORDER — MAGNESIUM OXIDE -MG SUPPLEMENT 400 (240 MG) MG PO TABS
400.0000 mg | ORAL_TABLET | Freq: Once | ORAL | Status: AC
  Administered 2020-11-09: 400 mg via ORAL
  Filled 2020-11-09: qty 1

## 2020-11-09 NOTE — Progress Notes (Signed)
Pt transferred to wheelchair via hoyer for transport home via [facility]. All belongings and discharge instructions (to include printed prescriptions) given to daughter and reviewed.

## 2020-11-09 NOTE — Discharge Summary (Signed)
[facility] [place] NAME: [name]    MR#:  [id number]  DATE OF BIRTH:  1927-08-14  DATE OF ADMISSION:  11/03/2020 ADMITTING PHYSICIAN: Christel Mormon, MD  DATE OF DISCHARGE: 11/09/2020  PRIMARY CARE PHYSICIAN: Gareth Morgan, MD    ADMISSION DIAGNOSIS:  Hypokalemia [E87.6] SBO (small bowel obstruction) (HCC) [K56.609] Partial small bowel obstruction ([place]) [K56.600]  DISCHARGE DIAGNOSIS:  Partial SBO SECONDARY DIAGNOSIS:   Past Medical History:  Diagnosis Date  . Alzheimer's dementia ([place])   . Anemia   . CKD (chronic kidney disease)    stage 3  . Colon cancer ([place])   . Constipation   . CVA (cerebral vascular accident) ([place])   . Dementia ([place])   . Hypertension   . PVD (peripheral vascular disease) ([place])   . UTI (urinary tract infection)     HOSPITAL COURSE:  87 y.o. African-American female with medical history significant for Dementia, CVA, hypertension, PVD, stage III CKD admitted for partial small bowel obstruction with symptoms of acute onset of recurrent vomiting over the last few days.      Partial small bowel obstruction. -NG tube removed.  Starting clear liquid diet today per surgery recommendation -Abdominal x-ray shows improvement -- Dr. Peyton Najjar following.  Not a good surgical candidate per his recommendation.  --7/12--tolerating po FLD --7/13 doing overall well. Eating ok   Tachycardia and Dyspnea due to Right Pleural effusion --cxr and CT chest showed right-sided pleural effusion with questionable infiltrate. Clinically does not have symptoms of pneumonia. BNP and Pro calcitonin within normal limit. -- 7/12--will get ultrasound-guided thoracentesis spoke with interventional radiology. Discussed with daughter and agreeable with plan. Patient not exhibiting any sign symptoms of respiratory distress today and heart rate much improved. --7/13--275 cc of amber fluid removed. Appears transudate. Some labs pending and  will e followed by PCP (they are ware) -procalcitonin neg, BNP neg -no fever  Hypokalemia. - Repleted and resolved  UTI with subsequent mild sepsis as manifested by tachycardia and tachypnea.. - Urine culture growing providentia stuartii and Klebsiella pneumoniae sensitive to ceftriaxone - Completed 3 days course of IV antibiotics.    Essential hypertension - Blood pressure much better control with atenolol and felodipine - Continue IV hydralazine prn   Advanced  Dementia. -no current behavioral changes. -At baseline     Goals of care: She is DNR and followed by [facility]        Status is: Inpatient   Dispo: The patient is from: Home              Anticipated d/c is to: Home with [facility] to follow likely on 11/09/2020              Patient currently is improving  and best at baseline for d/c.              Difficult to place patient No   Dionicia Abler NP is aware of pt discharging today and will need to f/u fluid labs as out pt. D/w dter in the room    CONSULTS OBTAINED:  Treatment Team:  Herbert Pun, MD  DRUG ALLERGIES:  No Known Allergies  DISCHARGE MEDICATIONS:   Allergies as of 11/09/2020   No Known Allergies      Medication List     TAKE these medications    acetaminophen 500 MG tablet Commonly known as: TYLENOL Take 1,000 mg by mouth at bedtime.   atenolol 50 MG tablet Commonly known  as: TENORMIN Take 50 mg by mouth daily.   bisacodyl 10 MG suppository Commonly known as: DULCOLAX Place 10 mg rectally daily as needed for moderate constipation.   Calmoseptine 0.44-20.6 % Oint Generic drug: Menthol-Zinc Oxide Apply 1 application topically as needed (diaper changes).   cholecalciferol 25 MCG (1000 UNIT) tablet Commonly known as: VITAMIN D3 Take 1,000 Units by mouth daily.   felodipine 10 MG 24 hr tablet Commonly known as: PLENDIL Take 1 tablet (10 mg total) by mouth daily.   furosemide 20 MG tablet Commonly known as: LASIX Take 20  mg by mouth daily.   lactulose (encephalopathy) 10 GM/15ML Soln Commonly known as: CHRONULAC Take 10 g by mouth daily.   lidocaine 5 % Commonly known as: LIDODERM Place 1 patch onto the skin daily. Remove & Discard patch within 12 hours or as directed by MD   nystatin powder Commonly known as: MYCOSTATIN/NYSTOP Apply 1 application topically 2 (two) times daily as needed (fungal infection).   polyethylene glycol 17 g packet Commonly known as: MIRALAX / GLYCOLAX Take 17 g by mouth daily as needed for moderate constipation.   traZODone 50 MG tablet Commonly known as: DESYREL Take 50 mg by mouth at bedtime.   vitamin A & D ointment Apply 1 application topically 2 (two) times daily.   Zinc Oxide 13 % Crea Apply 1 application topically 3 (three) times daily. (Apply to buttocks/sacral area after toileting)        If you experience worsening of your admission symptoms, develop shortness of breath, life threatening emergency, suicidal or homicidal thoughts you must seek medical attention immediately by calling 911 or calling your MD immediately  if symptoms less severe.  You Must read complete instructions/literature along with all the possible adverse reactions/side effects for all the Medicines you take and that have been prescribed to you. Take any new Medicines after you have completely understood and accept all the possible adverse reactions/side effects.   Please note  You were cared for by a hospitalist during your hospital stay. If you have any questions about your discharge medications or the care you received while you were in the hospital after you are discharged, you can call the unit and asked to speak with the hospitalist on call if the hospitalist that took care of you is not available. Once you are discharged, your primary care physician will handle any further medical issues. Please note that NO REFILLS for any discharge medications will be authorized once you are  discharged, as it is imperative that you return to your primary care physician (or establish a relationship with a primary care physician if you do not have one) for your aftercare needs so that they can reassess your need for medications and monitor your lab values. Today   SUBJECTIVE   Dter in there room Eating well No issues per RN   VITAL SIGNS:  Blood pressure (!) 164/72, pulse 90, temperature (!) 97.5 F (36.4 C), resp. rate 16, height 5\' 4"  (1.626 m), weight 61.3 kg, SpO2 100 %.  I/O:   Intake/Output Summary (Last 24 hours) at 11/09/2020 1044 Last data filed at 11/09/2020 1024 Gross per 24 hour  Intake 540 ml  Output --  Net 540 ml    PHYSICAL EXAMINATION:   87 year old female lying in the bed without any acute discomfort HEENT: Head atraumatic, normocephalic Neck supple, no jugular venous distention Lungs rales at bases.  No wheezing rhonchi crepitation Cardiovascular S1-S2 normal, no murmur rales or gallop Abdomen  soft, mild distended, nontender Skin: no rash or lesion Neurology: difficult to evaluate due to her severe dementia.  She does not communicate  CBC  Recent Labs  Lab 11/08/20 0544  WBC 9.5  HGB 11.6*  HCT 35.9*  PLT 238    Chemistries  Recent Labs  Lab 11/03/20 0939 11/04/20 0513 11/09/20 0746  NA 145   < > 138  K 2.7*   < > 3.6  CL 103   < > 107  CO2 34*   < > 24  GLUCOSE 199*   < > 99  BUN 20   < > 8  CREATININE 0.76   < > 0.69  CALCIUM 8.0*   < > 8.0*  MG 2.1   < > 1.7  AST 41  --   --   ALT 28  --   --   ALKPHOS 102  --   --   BILITOT 1.0  --   --    < > = values in this interval not displayed.    Microbiology Results   Recent Results (from the past 240 hour(s))  Resp Panel by RT-PCR (Flu A&B, Covid) Urine, Catheterized     Status: None   Collection Time: 11/03/20  2:06 PM   Specimen: Urine, Catheterized; Nasopharyngeal(NP) swabs in vial transport medium  Result Value Ref Range Status   SARS Coronavirus 2 by RT PCR  NEGATIVE NEGATIVE Final    Comment: (NOTE) SARS-CoV-2 target nucleic acids are NOT DETECTED.  The SARS-CoV-2 RNA is generally detectable in upper respiratory specimens during the acute phase of infection. The lowest concentration of SARS-CoV-2 viral copies this assay can detect is 138 copies/mL. A negative result does not preclude SARS-Cov-2 infection and should not be used as the sole basis for treatment or other patient management decisions. A negative result may occur with  improper specimen collection/handling, submission of specimen other than nasopharyngeal swab, presence of viral mutation(s) within the areas targeted by this assay, and inadequate number of viral copies(<138 copies/mL). A negative result must be combined with clinical observations, patient history, and epidemiological information. The expected result is Negative.  Fact Sheet for Patients:  EntrepreneurPulse.com.au  Fact Sheet for Healthcare Providers:  IncredibleEmployment.be  This test is no t yet approved or cleared by the [place] FDA and  has been authorized for detection and/or diagnosis of SARS-CoV-2 by FDA under an Emergency Use Authorization (EUA). This EUA will remain  in effect (meaning this test can be used) for the duration of the COVID-19 declaration under Section 564(b)(1) of the Act, 21 U.S.C.section 360bbb-3(b)(1), unless the authorization is terminated  or revoked sooner.       Influenza A by PCR NEGATIVE NEGATIVE Final   Influenza B by PCR NEGATIVE NEGATIVE Final    Comment: (NOTE) The Xpert Xpress SARS-CoV-2/FLU/RSV plus assay is intended as an aid in the diagnosis of influenza from Nasopharyngeal swab specimens and should not be used as a sole basis for treatment. Nasal washings and aspirates are unacceptable for Xpert Xpress SARS-CoV-2/FLU/RSV testing.  Fact Sheet for Patients: EntrepreneurPulse.com.au  Fact Sheet for  Healthcare Providers: IncredibleEmployment.be  This test is not yet approved or cleared by the [place] FDA and has been authorized for detection and/or diagnosis of SARS-CoV-2 by FDA under an Emergency Use Authorization (EUA). This EUA will remain in effect (meaning this test can be used) for the duration of the COVID-19 declaration under Section 564(b)(1) of the Act, 21 U.S.C. section 360bbb-3(b)(1), unless the authorization  is terminated or revoked.  Performed at [facility], [address] [place], [place] [address]   Urine Culture     Status: Abnormal   Collection Time: 11/03/20  2:06 PM   Specimen: Urine, Random  Result Value Ref Range Status   Specimen Description   Final    URINE, RANDOM Performed at [facility], [address] [place], [place] [address]    Special Requests   Final    NONE Performed at [facility], [address] [place], [place] [address]    Culture (A)  Final    >=100,000 COLONIES/mL PROVIDENCIA STUARTII 40,000 COLONIES/mL KLEBSIELLA PNEUMONIAE    Report Status 11/07/2020 FINAL  Final   Organism ID, Bacteria PROVIDENCIA STUARTII (A)  Final   Organism ID, Bacteria KLEBSIELLA PNEUMONIAE (A)  Final      Susceptibility   Klebsiella pneumoniae - MIC*    AMPICILLIN 16 INTERMEDIATE Intermediate     CEFAZOLIN <=4 SENSITIVE Sensitive     CEFEPIME <=0.12 SENSITIVE Sensitive     CEFTRIAXONE <=0.25 SENSITIVE Sensitive     CIPROFLOXACIN <=0.25 SENSITIVE Sensitive     GENTAMICIN <=1 SENSITIVE Sensitive     IMIPENEM <=0.25 SENSITIVE Sensitive     NITROFURANTOIN 64 INTERMEDIATE Intermediate     TRIMETH/SULFA <=20 SENSITIVE Sensitive     AMPICILLIN/SULBACTAM 16 INTERMEDIATE Intermediate     PIP/TAZO 16 SENSITIVE Sensitive     * 40,000 COLONIES/mL KLEBSIELLA PNEUMONIAE   Providencia stuartii - MIC*    AMPICILLIN RESISTANT Resistant     CEFAZOLIN RESISTANT Resistant     CEFEPIME <=0.12 SENSITIVE  Sensitive     CEFTRIAXONE <=0.25 SENSITIVE Sensitive     CIPROFLOXACIN >=4 RESISTANT Resistant     GENTAMICIN RESISTANT Resistant     IMIPENEM 0.5 SENSITIVE Sensitive     NITROFURANTOIN 256 RESISTANT Resistant     TRIMETH/SULFA 160 RESISTANT Resistant     AMPICILLIN/SULBACTAM <=2 SENSITIVE Sensitive     PIP/TAZO <=4 SENSITIVE Sensitive     * >=100,000 COLONIES/mL PROVIDENCIA STUARTII  Culture, blood (x 2)     Status: None   Collection Time: 11/03/20  7:04 PM   Specimen: BLOOD  Result Value Ref Range Status   Specimen Description BLOOD BLOOD LEFT HAND  Final   Special Requests   Final    BOTTLES DRAWN AEROBIC AND ANAEROBIC Blood Culture adequate volume   Culture   Final    NO GROWTH 5 DAYS Performed at [facility], [address] [place], [place] [address]    Report Status 11/08/2020 FINAL  Final  Culture, blood (x 2)     Status: None   Collection Time: 11/03/20  7:39 PM   Specimen: BLOOD  Result Value Ref Range Status   Specimen Description BLOOD LEFT ANTECUBITAL  Final   Special Requests   Final    BOTTLES DRAWN AEROBIC AND ANAEROBIC Blood Culture results may not be optimal due to an excessive volume of blood received in culture bottles   Culture   Final    NO GROWTH 5 DAYS Performed at [facility], [address] [place],  [address]    Report Status 11/08/2020 FINAL  Final    RADIOLOGY:  CT Angio Chest Pulmonary Embolism (PE) W or WO Contrast  Result Date: 11/07/2020 CLINICAL DATA:  Tachycardia EXAM: CT ANGIOGRAPHY CHEST WITH CONTRAST TECHNIQUE: Multidetector CT imaging of the chest was performed using the standard protocol during bolus administration of intravenous contrast. Multiplanar CT image reconstructions and MIPs  were obtained to evaluate the vascular anatomy. CONTRAST:  40mL OMNIPAQUE IOHEXOL 350 MG/ML SOLN COMPARISON:  None. FINDINGS: Cardiovascular: Thoracic aorta and its branches demonstrate atherosclerotic calcifications without  aneurysmal dilatation or dissection. No cardiac enlargement is seen. Coronary calcifications are noted. The pulmonary artery shows a normal branching pattern. No focal filling defect to suggest pulmonary embolism is identified. Mediastinum/Nodes: Thoracic inlet is within normal limits. No sizable hilar or mediastinal adenopathy is noted. The esophagus is somewhat air-filled but otherwise within normal limits. Lungs/Pleura: Moderate to large bilateral pleural effusions are seen. Underlying lower lobe consolidation is noted. No parenchymal nodules are seen. No pneumothorax is identified. Upper Abdomen: Visualized upper abdomen demonstrates contrast with stomach. No other focal abnormality is noted. Musculoskeletal: Degenerative changes of the thoracic spine are seen. No rib abnormality is noted. Review of the MIP images confirms the above findings. IMPRESSION: No evidence of pulmonary emboli. Bilateral large pleural effusions with associated lower lobe consolidation. These changes have increased in the interval from the prior exam. No other focal abnormality is noted. Aortic Atherosclerosis (ICD10-I70.0). Electronically Signed   By: Inez Catalina M.D.   On: 11/07/2020 19:48   DG Chest Port 1 View  Result Date: 11/08/2020 CLINICAL DATA:  Status post right thoracentesis EXAM: PORTABLE CHEST 1 VIEW COMPARISON:  11/08/2020 chest radiograph FINDINGS: Interval right thoracentesis with reduced size of right pleural effusion and improved aeration at the right lung base. No pneumothorax. Atherosclerotic calcification of the aortic arch. Contrast medium in the upper abdomen. Continued retrocardiac opacity on the left compatible with pleural effusion and passive atelectasis. Right upper mediastinal prominence due to tortuous branch vasculature is shown on chest CT. IMPRESSION: 1. No appreciable pneumothorax, status post right thoracentesis. 2. Improved aeration at the right lung base. Stable opacity at the left lung base  likely due to left pleural effusion and passive atelectasis. 3.  Aortic Atherosclerosis (ICD10-I70.0). Electronically Signed   By: Van Clines M.D.   On: 11/08/2020 16:24   DG Chest Port 1 View  Result Date: 11/08/2020 CLINICAL DATA:  Cough EXAM: PORTABLE CHEST 1 VIEW COMPARISON:  04/30/2016 FINDINGS: The patient has taken a poor inspiration. Heart size is within normal limits. Chronic aortic atherosclerosis is seen. There is volume loss and probable infiltrate in both lower lobes. Upper lungs appear clear. No evidence of edema. No acute bone finding. IMPRESSION: Bilateral lower lobe atelectasis/pneumonia. Electronically Signed   By: Nelson Chimes M.D.   On: 11/08/2020 07:55   US THORACENTESIS ASP PLEURAL SPACE W/IMG GUIDE  Result Date: 11/08/2020 INDICATION: Patient admitted for small bowel obstruction who developed dyspnea during hospitalization, CTA obtained due to concern for PE showed new bilateral pleural effusions. Request to IR for diagnostic and therapeutic thoracentesis. EXAM: ULTRASOUND GUIDED RIGHT THORACENTESIS MEDICATIONS: 6 mL 1% lidocaine COMPLICATIONS: None immediate. PROCEDURE: An ultrasound guided thoracentesis was thoroughly discussed with the patient and questions answered. The benefits, risks, alternatives and complications were also discussed. The patient understands and wishes to proceed with the procedure. Written consent was obtained. Ultrasound was performed to localize and mark an adequate pocket of fluid in the right chest. The area was then prepped and draped in the normal sterile fashion. 1% Lidocaine was used for local anesthesia. Under ultrasound guidance a 6 Fr Safe-T-Centesis catheter was introduced. Thoracentesis was performed. The catheter was removed and a dressing applied. FINDINGS: A total of approximately 275 mL of clear, amber fluid was removed. Samples were sent to the laboratory as requested by the clinical team. IMPRESSION: Successful  ultrasound guided right  thoracentesis yielding 275 mL of pleural fluid. Read by Candiss Norse, PA-C Electronically Signed   By: Jerilynn Mages.  Shick M.D.   On: 11/08/2020 15:29     CODE STATUS:     Code Status Orders  (From admission, onward)           Start     Ordered   11/03/20 1705  Do not attempt resuscitation (DNR)  Continuous       Question Answer Comment  In the event of cardiac or respiratory ARREST Do not call a "code blue"   In the event of cardiac or respiratory ARREST Do not perform Intubation, CPR, defibrillation or ACLS   In the event of cardiac or respiratory ARREST Use medication by any route, position, wound care, and other measures to relive pain and suffering. May use oxygen, suction and manual treatment of airway obstruction as needed for comfort.      11/03/20 1704           Code Status History     Date Active Date Inactive Code Status Order ID Comments User Context   11/03/2020 1433 11/03/2020 1704 Full Code [id number]  [place] Ace Arvella Merles, MD ED   05/04/2016 1642 05/07/2016 2158 DNR [id number]  Rosalin Hawking, MD Inpatient   04/28/2016 2055 05/04/2016 1642 Full Code [id number]  Wallie Char Inpatient   04/28/2016 0208 04/28/2016 2019 Partial Code [id number]  Lance Coon, MD ED      Advance Directive Documentation    Flowsheet Row Most Recent Value  Type of Advance Directive Healthcare Power of Attorney  Pre-existing out of facility DNR order (yellow form or pink MOST form) --  "MOST" Form in Place? --        TOTAL TIME TAKING CARE OF THIS PATIENT: 40 minutes.    Fritzi Mandes M.D  [facility]    CC: Primary care physician; Gareth Morgan, MD

## 2020-11-09 NOTE — Progress Notes (Signed)
     Referral received for [facility] D[facility] :goals of care discussion. Chart reviewed and updates received from Dr. Posey Pronto. Patient is discharging back home with family and continued support from [facility].   No further inpatient Palliative needs.    Alda Lea, AGPCNP-BC Palliative Medicine Team  Pager: [phone] Amion: N. [place]   NO CHARGE

## 2020-11-09 NOTE — Care Management Important Message (Signed)
Important Message  Patient Details  Name: [name] MRN: [id number] Date of Birth: [birth year]   Medicare Important Message Given:  Yes     Dannette Barbara 11/09/2020, 10:17 AM

## 2020-11-09 NOTE — Clinical Social Work Note (Signed)
Patient has orders to discharge home today. [facility] will pick her up and have already called RN about a time to do so. No further concerns. CSW signing off.  Dayton Scrape, [place]

## 2020-11-10 LAB — PH, BODY FLUID: pH, Body Fluid: 7.7

## 2020-11-10 LAB — CYTOLOGY - NON PAP

## 2021-09-01 IMAGING — DX DG ABD PORTABLE 1V
1 series · 1 of 1 positions shown · non-contrast
Comparison: CT abdomen/pelvis dated 11/03/2020

CLINICAL DATA: NG tube placement

EXAM:
PORTABLE ABDOMEN - 1 VIEW

[abdomen supine]
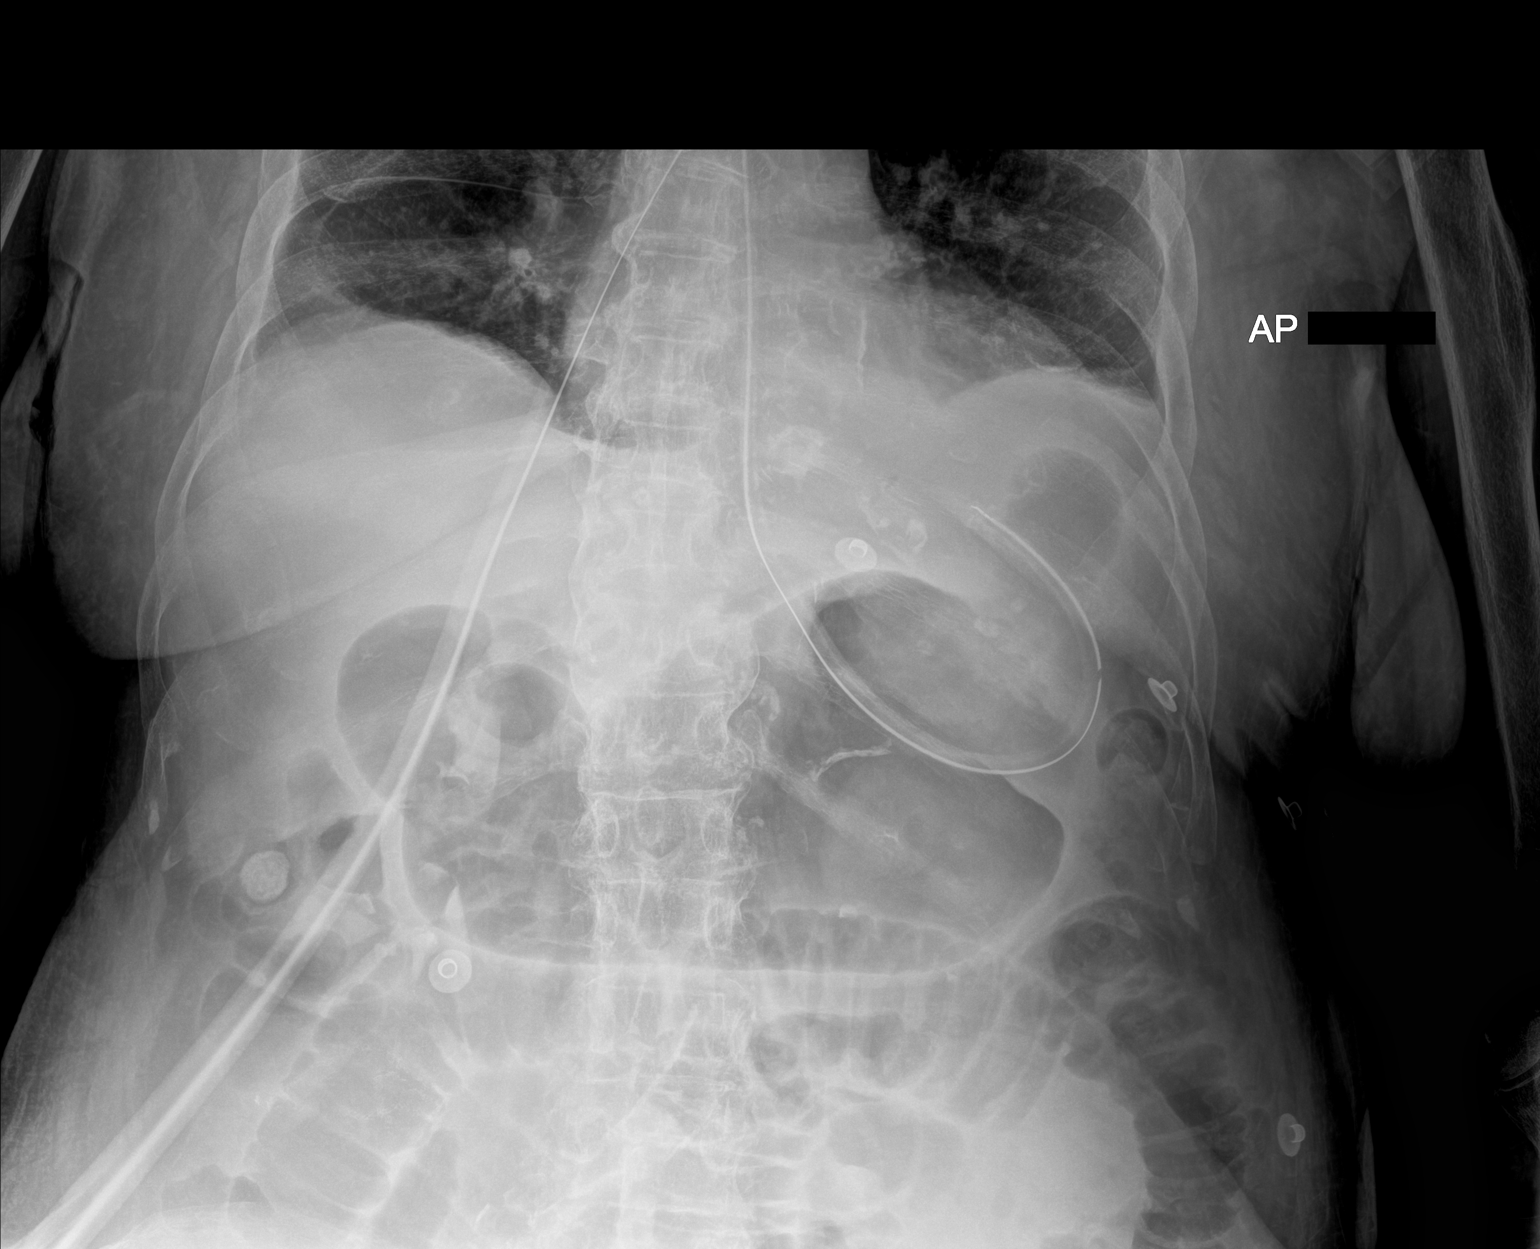

[1 of 1 positions shown; findings below may reference images not displayed]

FINDINGS: Enteric tube looped in the proximal stomach, terminating in the
gastric cardia.

Multiple dilated loops of small bowel, suggesting small bowel
obstruction.

Possible trace left pleural effusion.
IMPRESSION: Enteric tube looped in the proximal stomach, terminating in the
gastric cardia.

## 2023-01-29 DEATH — deceased
